# Patient Record
Sex: Female | Born: 2005 | Race: White | Hispanic: Yes | Marital: Single | Smoking: Never smoker
Health system: Southern US, Community
[De-identification: ages and names within clinical notes are randomized; demographics above are authoritative.]

## PROBLEM LIST (undated history)

## (undated) ENCOUNTER — Inpatient Hospital Stay (HOSPITAL_COMMUNITY): Payer: Self-pay

## (undated) ENCOUNTER — Emergency Department (HOSPITAL_COMMUNITY): Admission: EM | Payer: Self-pay | Source: Home / Self Care

## (undated) DIAGNOSIS — J302 Other seasonal allergic rhinitis: Secondary | ICD-10-CM

## (undated) DIAGNOSIS — Z8659 Personal history of other mental and behavioral disorders: Secondary | ICD-10-CM

## (undated) DIAGNOSIS — O24419 Gestational diabetes mellitus in pregnancy, unspecified control: Secondary | ICD-10-CM

## (undated) DIAGNOSIS — O26649 Intrahepatic cholestasis of pregnancy, unspecified trimester: Secondary | ICD-10-CM

## (undated) DIAGNOSIS — F32A Depression, unspecified: Secondary | ICD-10-CM

## (undated) DIAGNOSIS — G43909 Migraine, unspecified, not intractable, without status migrainosus: Secondary | ICD-10-CM

## (undated) DIAGNOSIS — F419 Anxiety disorder, unspecified: Secondary | ICD-10-CM

## (undated) DIAGNOSIS — K589 Irritable bowel syndrome without diarrhea: Secondary | ICD-10-CM

## (undated) HISTORY — PX: NO PAST SURGERIES: SHX2092

## (undated) HISTORY — DX: Gestational diabetes mellitus in pregnancy, unspecified control: O24.419

## (undated) HISTORY — DX: Intrahepatic cholestasis of pregnancy, unspecified trimester: O26.649

---
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
## 2024-01-01 ENCOUNTER — Inpatient Hospital Stay (HOSPITAL_COMMUNITY)
Admission: AD | Admit: 2024-01-01 | Discharge: 2024-01-01 | Disposition: A | Discharge status: Home / Self Care | Payer: MEDICAID | Attending: Obstetrics and Gynecology | Admitting: Obstetrics and Gynecology

## 2024-01-01 ENCOUNTER — Encounter (HOSPITAL_COMMUNITY): Payer: Self-pay | Admitting: Obstetrics and Gynecology

## 2024-01-01 ENCOUNTER — Inpatient Hospital Stay (HOSPITAL_COMMUNITY): Payer: MEDICAID

## 2024-01-01 DIAGNOSIS — O209 Hemorrhage in early pregnancy, unspecified: Secondary | ICD-10-CM

## 2024-01-01 DIAGNOSIS — Z113 Encounter for screening for infections with a predominantly sexual mode of transmission: Secondary | ICD-10-CM | POA: Diagnosis not present

## 2024-01-01 DIAGNOSIS — N898 Other specified noninflammatory disorders of vagina: Secondary | ICD-10-CM | POA: Diagnosis present

## 2024-01-01 DIAGNOSIS — Z3A01 Less than 8 weeks gestation of pregnancy: Secondary | ICD-10-CM

## 2024-01-01 LAB — CBC
HCT: 33.6 % — ABNORMAL LOW (ref 36.0–46.0)
Hemoglobin: 11.8 g/dL — ABNORMAL LOW (ref 12.0–15.0)
MCH: 30.3 pg (ref 26.0–34.0)
MCHC: 35.1 g/dL (ref 30.0–36.0)
MCV: 86.4 fL (ref 80.0–100.0)
Platelets: 248 K/uL (ref 150–400)
RBC: 3.89 MIL/uL (ref 3.87–5.11)
RDW: 13.9 % (ref 11.5–15.5)
WBC: 10.2 K/uL (ref 4.0–10.5)
nRBC: 0 % (ref 0.0–0.2)

## 2024-01-01 LAB — WET PREP, GENITAL
Clue Cells Wet Prep HPF POC: NONE SEEN
Sperm: NONE SEEN
Trich, Wet Prep: NONE SEEN
WBC, Wet Prep HPF POC: >=10 — AB (ref <10)
Yeast Wet Prep HPF POC: NONE SEEN

## 2024-01-01 LAB — HCG, QUANTITATIVE, PREGNANCY: hCG, Beta Chain, Quant, S: 120853 m[IU]/mL — ABNORMAL HIGH (ref <5)

## 2024-01-01 LAB — POCT PREGNANCY, URINE: Preg Test, Ur: POSITIVE — AB

## 2024-01-01 NOTE — Progress Notes (Signed)
 Written and verbal d/c instructions given and understanding voiced.

## 2024-01-01 NOTE — Discharge Instructions (Signed)
 It is a pleasure taking care of you tonight.  I am not quite sure where the vaginal bleeding is coming from but likely related to your recent intercourse.  Cultures are still pending from the samples we collected.  If your bleeding worsens I want you to return for further evaluation.  The ultrasound did show you have a baby growing in your uterus with a heartbeat.  Below is a list of medications that are safe to take during pregnancy.  I hope you have a wonderful rest of your night!  Safe Medications in Pregnancy   Acne:  Benzoyl Peroxide  Salicylic Acid   Backache/Headache:  Tylenol : 2 regular strength every 4 hours OR               2 Extra strength every 6 hours   Colds/Coughs/Allergies:  Benadryl  (alcohol free) 25 mg every 6 hours as needed  Breath right strips  Claritin  Cepacol throat lozenges  Chloraseptic throat spray  Cold-Eeze- up to three times per day  Cough drops, alcohol free  Flonase (by prescription only)  Guaifenesin  Mucinex  Robitussin DM (plain only, alcohol free)  Saline nasal spray/drops  Sudafed (pseudoephedrine) & Actifed * use only after [redacted] weeks gestation and if you do not have high blood pressure  Tylenol   Vicks Vaporub  Zinc lozenges  Zyrtec   Constipation:  Colace  Ducolax suppositories  Fleet enema  Glycerin suppositories  Metamucil  Milk of magnesia  Miralax  Senokot  Smooth move tea   Diarrhea:  Kaopectate  Imodium A-D   *NO pepto Bismol   Hemorrhoids:  Anusol  Anusol HC  Preparation H  Tucks   Indigestion:  Tums  Maalox  Mylanta  Zantac  Pepcid    Insomnia:  Benadryl  (alcohol free) 25mg  every 6 hours as needed  Tylenol  PM  Unisom, no Gelcaps   Leg Cramps:  Tums  MagGel   Nausea/Vomiting:  Bonine  Dramamine  Emetrol  Ginger extract  Sea bands  Meclizine  Nausea medication to take during pregnancy:  Unisom (doxylamine succinate 25 mg tablets) Take one tablet daily at bedtime. If symptoms are not adequately  controlled, the dose can be increased to a maximum recommended dose of two tablets daily (1/2 tablet in the morning, 1/2 tablet mid-afternoon and one at bedtime).  Vitamin B6 100mg  tablets. Take one tablet twice a day (up to 200 mg per day).   Skin Rashes:  Aveeno products  Benadryl  cream or 25mg  every 6 hours as needed  Calamine Lotion  1% cortisone cream   Yeast infection:  Gyne-lotrimin 7  Monistat 7    **If taking multiple medications, please check labels to avoid duplicating the same active ingredients  **take medication as directed on the label  ** Do not exceed 4000 mg of tylenol  in 24 hours  **Do not take medications that contain aspirin or ibuprofen 

## 2024-01-01 NOTE — MAU Provider Note (Signed)
 History     CSN: [id number]  Arrival date and time: 01/01/24 1955   None     Chief Complaint  Patient presents with   Vaginal Discharge   HPI Patient presenting for vaginal bleeding in early pregnancy.  Reports miscarriage last year so she is just worried because of the bleeding.  Reports that she noticed brownish discharge when she wiped and then it was pink today.  Intercourse approximately 1 week ago.  Reports chronic issues with upper right and left abdominal pain.  Had her gallbladder removed earlier this year.  Denies any other symptoms.  OB History     Gravida  2   Para      Term      Preterm      AB  1   Living         SAB  1   IAB      Ectopic      Multiple      Live Births              Past Medical History:  Diagnosis Date   Depression    History of suicidal ideation    IBS (irritable bowel syndrome)    Migraines    Seasonal allergies    Severe anxiety    followed by dr macarthur piety    Past Surgical History:  Procedure Laterality Date   CHOLECYSTECTOMY N/A 10/14/2023   Procedure: LAPAROSCOPIC CHOLECYSTECTOMY;  Surgeon: Belinda Cough, MD;  Location: [facility]S;  Service: General;  Laterality: N/A;   COLONOSCOPY N/A 10/11/2023   Procedure: COLONOSCOPY;  Surgeon: Dianna Specking, MD;  Location: [facility] ENDOSCOPY;  Service: Gastroenterology;  Laterality: N/A;   DILATION AND EVACUATION N/A 12/06/2022   Procedure: DILATATION AND EVACUATION;  Surgeon: Erik Kieth BROCKS, MD;  Location[facility];  Service: Gynecology;  Laterality: N/A;    Family History  Problem Relation Age of Onset   Diabetes Mother    Healthy Father    Hypertension Neg Hx    Cancer Neg Hx     Social History   Tobacco Use   Smoking status: Never    Passive exposure: Current   Smokeless tobacco: Never  Vaping Use   Vaping status: Never Used  Substance Use Topics   Alcohol use: Never   Drug use: Yes    Types: Marijuana    Comment: pos drug screen     Allergies:  Allergies  Allergen Reactions   Azithromycin  Itching and Nausea And Vomiting    Severe N/V and severe itching   Lactose Intolerance (Gi) Other (See Comments)    GI Upset    Medications Prior to Admission  Medication Sig Dispense Refill Last Dose/Taking   acetaminophen  (TYLENOL ) 500 MG tablet Take 2 tablets (1,000 mg total) by mouth every 6 (six) hours as needed for mild pain (pain score 1-3).      ibuprofen  (ADVIL ) 800 MG tablet Take 1 tablet (800 mg total) by mouth every 8 (eight) hours as needed. TAKE WITH FOOD 30 tablet 0    oxyCODONE  (OXY IR/ROXICODONE ) 5 MG immediate release tablet Take 1 tablet (5 mg total) by mouth every 4 (four) hours as needed for moderate pain (pain score 4-6). 15 tablet 0     Review of Systems  Gastrointestinal:  Positive for abdominal pain and nausea. Negative for constipation and diarrhea.  Genitourinary:  Positive for vaginal bleeding and vaginal discharge.   Physical Exam   Blood pressure (!) 103/51, pulse 80, temperature  98.8 F (37.1 C), resp. rate 17, height 4' 11 (1.499 m), weight 47.8 kg, last menstrual period 11/04/2023, SpO2 100%.  Physical Exam Vitals and nursing note reviewed.  Constitutional:      Appearance: Normal appearance.  HENT:     Head: Normocephalic and atraumatic.     Nose: No congestion or rhinorrhea.  Eyes:     Extraocular Movements: Extraocular movements intact.  Cardiovascular:     Rate and Rhythm: Normal rate.  Pulmonary:     Effort: Pulmonary effort is normal.  Abdominal:     Palpations: Abdomen is soft.     Tenderness: There is no abdominal tenderness.  Musculoskeletal:        General: Normal range of motion.     Cervical back: Normal range of motion.  Skin:    General: Skin is warm.     Capillary Refill: Capillary refill takes less than 2 seconds.  Neurological:     General: No focal deficit present.     Mental Status: She is alert.     Cranial Nerves: No cranial nerve deficit.   Psychiatric:        Mood and Affect: Mood normal.        Behavior: Behavior normal.     MAU Course  Procedures  MDM CBC hCG level Ultrasound  Assessment and Plan  Allison Trevino is a 18 year old G2, P0 at [redacted] weeks gestation presenting for vaginal bleeding in early pregnancy  Vaginal bleeding in early pregnancy Noticed spotting today.  Intercourse approximately 1 week ago.  Wet prep showing white blood cells but otherwise within normal limits.  GC/chlamydia pending.  Ultrasound showing viable IUP measuring 7 weeks and 5 days.  No subchorionic hemorrhage visualized.  Discussed that this could be early signs of miscarriage but could also be cervical friability.  Discussed tricked return precautions.  Patient discharged home.  Deantre Bourdon V Reatha Sur 01/01/2024, 11:32 PM

## 2024-01-01 NOTE — MAU Note (Signed)
 Allison Trevino is a 18 y.o. at Unknown here in MAU reporting light and dark brown and pink vag d/c today.  Having some lower abd cramping.   LMP: 11/04/23 Onset of complaint: today Pain score: 6 Vitals:   01/01/24 2024 01/01/24 2028  BP:  (!) 103/51  Pulse: 80   Resp: 17   Temp: 98.8 F (37.1 C)   SpO2: 100%      FHT: n/a  Lab orders placed from triage: upt and u/a

## 2024-01-02 LAB — GC/CHLAMYDIA PROBE AMP (~~LOC~~) NOT AT ARMC
Chlamydia: NEGATIVE
Comment: NEGATIVE
Comment: NORMAL
Neisseria Gonorrhea: NEGATIVE

## 2024-01-05 ENCOUNTER — Other Ambulatory Visit: Payer: MEDICAID

## 2024-01-15 ENCOUNTER — Other Ambulatory Visit (INDEPENDENT_AMBULATORY_CARE_PROVIDER_SITE_OTHER): Payer: MEDICAID

## 2024-01-15 ENCOUNTER — Ambulatory Visit: Payer: MEDICAID

## 2024-01-15 VITALS — BP 123/71 | HR 61 | Wt 103.1 lb

## 2024-01-15 DIAGNOSIS — O0990 Supervision of high risk pregnancy, unspecified, unspecified trimester: Secondary | ICD-10-CM | POA: Insufficient documentation

## 2024-01-15 DIAGNOSIS — Z362 Encounter for other antenatal screening follow-up: Secondary | ICD-10-CM

## 2024-01-15 DIAGNOSIS — Z3A09 9 weeks gestation of pregnancy: Secondary | ICD-10-CM

## 2024-01-15 DIAGNOSIS — Z1331 Encounter for screening for depression: Secondary | ICD-10-CM

## 2024-01-15 DIAGNOSIS — Z3480 Encounter for supervision of other normal pregnancy, unspecified trimester: Secondary | ICD-10-CM | POA: Insufficient documentation

## 2024-01-15 DIAGNOSIS — Z3481 Encounter for supervision of other normal pregnancy, first trimester: Secondary | ICD-10-CM | POA: Diagnosis not present

## 2024-01-15 NOTE — Progress Notes (Signed)
 New OB Intake  I connected with Allison Trevino  on 01/15/24 at  2:10 PM EDT by In Person Visit and verified that I am speaking with the correct person using two identifiers. Nurse is located at [facility] and pt is located at [place].  I discussed the limitations, risks, security and privacy concerns of performing an evaluation and management service by telephone and the availability of in person appointments. I also discussed with the patient that there may be a patient responsible charge related to this service. The patient expressed understanding and agreed to proceed.  I explained I am completing New OB Intake today. We discussed EDD of 08/14/24 based on LMP of 11/08/23. Pt is G2P0010. I reviewed her allergies, medications and Medical/Surgical/OB history.    Patient Active Problem List   Diagnosis Date Noted   Acute colitis 10/11/2023   Colitis 10/11/2023   Pancolitis (HCC) 10/10/2023   Acute anemia 10/10/2023   Nausea & vomiting 10/09/2023   Chlamydia infection affecting pregnancy 11/13/2022   Abnormal involuntary movement 04/08/2019   Anxiety state 04/08/2019   Insomnia 04/08/2019     Concerns addressed today  Delivery Plans Plans to deliver at [facility]. Discussed the nature of our practice with multiple providers including residents and students. Due to the size of the practice, the delivering provider may not be the same as those providing prenatal care.   Patient is interested in water birth.  MyChart/Babyscripts MyChart access verified. I explained pt will have some visits in office and some virtually. Babyscripts instructions given and order placed. Patient verifies receipt of registration text/e-mail. Account successfully created and app downloaded. If patient is a candidate for Optimized scheduling, add to sticky note.   Blood Pressure Cuff/Weight Scale Pt has BP cuff at home. Explained after first prenatal appt pt will check weekly and document in Babyscripts.  Patient does not have weight scale; patient may purchase if they desire to track weight weekly in Babyscripts.  Anatomy US  Explained first scheduled US  will be around 19 weeks. Anatomy US  scheduled for TBD at TBD.  Is patient a candidate for Babyscripts Optimization? Yes, patient accepted    First visit review I reviewed new OB appt with patient. Explained pt will be seen by Nidia Daring, NP at first visit. Discussed [place] genetic screening with patient. Requests Panorama and Horizon.. Routine prenatal labs OB Urine only collected at today's visit. Initial OB labs deferred to New OB appt.   Last Pap No results found for: DIAGPAP  Allison CHRISTELLA Ober, RN 01/15/2024  2:55 PM

## 2024-01-15 NOTE — Patient Instructions (Signed)

## 2024-01-16 ENCOUNTER — Telehealth: Payer: Self-pay

## 2024-01-16 NOTE — Telephone Encounter (Signed)
 Returned call to advise pt that dentist office stated that they did not have the pregnancy clearance letter. Also advised pt that the dentist advised to take OTC Tylenol  for pain, no other pain medications needed. Advised dentist and pt that we will fax over clearance letter.

## 2024-01-17 ENCOUNTER — Ambulatory Visit: Payer: Self-pay | Admitting: Obstetrics and Gynecology

## 2024-01-17 ENCOUNTER — Inpatient Hospital Stay (HOSPITAL_COMMUNITY)
Admission: AD | Admit: 2024-01-17 | Discharge: 2024-01-17 | Disposition: A | Discharge status: Home / Self Care | Payer: MEDICAID | Attending: Obstetrics and Gynecology | Admitting: Obstetrics and Gynecology

## 2024-01-17 ENCOUNTER — Encounter (HOSPITAL_COMMUNITY): Payer: Self-pay | Admitting: Obstetrics and Gynecology

## 2024-01-17 ENCOUNTER — Other Ambulatory Visit: Payer: Self-pay

## 2024-01-17 DIAGNOSIS — Z3480 Encounter for supervision of other normal pregnancy, unspecified trimester: Secondary | ICD-10-CM

## 2024-01-17 DIAGNOSIS — R1084 Generalized abdominal pain: Secondary | ICD-10-CM

## 2024-01-17 DIAGNOSIS — O26891 Other specified pregnancy related conditions, first trimester: Secondary | ICD-10-CM

## 2024-01-17 DIAGNOSIS — Z3A10 10 weeks gestation of pregnancy: Secondary | ICD-10-CM | POA: Diagnosis not present

## 2024-01-17 DIAGNOSIS — K0889 Other specified disorders of teeth and supporting structures: Secondary | ICD-10-CM | POA: Diagnosis not present

## 2024-01-17 DIAGNOSIS — M545 Low back pain, unspecified: Secondary | ICD-10-CM | POA: Insufficient documentation

## 2024-01-17 LAB — CBC
HCT: 33.3 % — ABNORMAL LOW (ref 36.0–46.0)
Hemoglobin: 11.4 g/dL — ABNORMAL LOW (ref 12.0–15.0)
MCH: 30.1 pg (ref 26.0–34.0)
MCHC: 34.2 g/dL (ref 30.0–36.0)
MCV: 87.9 fL (ref 80.0–100.0)
Platelets: 221 K/uL (ref 150–400)
RBC: 3.79 MIL/uL — ABNORMAL LOW (ref 3.87–5.11)
RDW: 13.8 % (ref 11.5–15.5)
WBC: 13.1 K/uL — ABNORMAL HIGH (ref 4.0–10.5)
nRBC: 0 % (ref 0.0–0.2)

## 2024-01-17 LAB — URINALYSIS, ROUTINE W REFLEX MICROSCOPIC
Bilirubin Urine: NEGATIVE
Glucose, UA: NEGATIVE mg/dL
Hgb urine dipstick: NEGATIVE
Ketones, ur: NEGATIVE mg/dL
Leukocytes,Ua: NEGATIVE
Nitrite: NEGATIVE
Protein, ur: NEGATIVE mg/dL
Specific Gravity, Urine: 1.031 — ABNORMAL HIGH (ref 1.005–1.030)
pH: 5 (ref 5.0–8.0)

## 2024-01-17 LAB — CULTURE, OB URINE

## 2024-01-17 LAB — URINE CULTURE, OB REFLEX

## 2024-01-17 MED ORDER — TRAMADOL HCL 50 MG PO TABS
50.0000 mg | ORAL_TABLET | Freq: Once | ORAL | Status: AC
  Administered 2024-01-17: 50 mg via ORAL

## 2024-01-17 MED ORDER — SIMETHICONE 80 MG PO CHEW: 80.0000 mg | CHEWABLE_TABLET | Freq: Four times a day (QID) | ORAL | 0 refills | Status: DC | PRN

## 2024-01-17 MED ORDER — TRAMADOL HCL 50 MG PO TABS: 50.0000 mg | ORAL_TABLET | Freq: Once | ORAL | Status: DC

## 2024-01-17 MED ORDER — HYOSCYAMINE SULFATE 0.125 MG SL SUBL
0.1250 mg | SUBLINGUAL_TABLET | Freq: Once | SUBLINGUAL | Status: AC
  Administered 2024-01-17: 0.125 mg via SUBLINGUAL
  Filled 2024-01-17: qty 1

## 2024-01-17 MED ORDER — ONDANSETRON 4 MG PO TBDP
4.0000 mg | ORAL_TABLET | Freq: Once | ORAL | Status: AC
  Administered 2024-01-17: 4 mg via ORAL
  Filled 2024-01-17: qty 1

## 2024-01-17 MED ORDER — SIMETHICONE 80 MG PO CHEW
80.0000 mg | CHEWABLE_TABLET | Freq: Once | ORAL | Status: AC
  Administered 2024-01-17: 80 mg via ORAL
  Filled 2024-01-17: qty 1

## 2024-01-17 MED ORDER — TRAMADOL HCL 50 MG PO TABS
25.0000 mg | ORAL_TABLET | Freq: Once | ORAL | Status: DC
  Filled 2024-01-17: qty 1

## 2024-01-17 MED ORDER — TRAMADOL HCL 50 MG PO TABS: 25.0000 mg | ORAL_TABLET | Freq: Four times a day (QID) | ORAL | 0 refills | Status: DC | PRN

## 2024-01-17 NOTE — MAU Provider Note (Signed)
 Chief Complaint: Abdominal Pain, Back Pain, and Dizziness   Event Date/Time   First Provider Initiated Contact with Patient 01/17/24 0418        SUBJECTIVE HPI: Allison Trevino is a 18 y.o. G2P0010 at [redacted]w[redacted]d by LMP who presents to maternity admissions reporting bilateral abdominal pain which started tonight   Also complains of tooth pain, states dentist would not give her anything for it. Mother of pt wonders if gallstones have come back  Had cholecystectomy in April  Discussed not likely without a gallbladder. Also pain is in a different location. . She denies vaginal bleeding, vaginal itching/burning, urinary symptoms, h/a, dizziness, n/v, or fever/chills.     Abdominal Pain The problem occurs intermittently. The quality of the pain is described as cramping. Pertinent negatives include no constipation, diarrhea or dysuria. Nothing relieves the symptoms. Past treatments include nothing.  RN Note: Allison Trevino is a 18 y.o. at [redacted]w[redacted]d here in MAU reporting: woke up about 11pm went to BR . Started having a pain in her right lower back and lower abd  that felt like when she had her gallbladder out.  Stated she had to sit  on the floor because she started to feel dizzy and everything ws getting blacK'. Currently she  is still feeling dizzy and pai in her back is in her right and left side. Stated she can't fee; her legs . Has been having nausea but has started vomiting since this pain started. Reports a light brown vag discharge since yesterday   Past Medical History:  Diagnosis Date   Depression    History of suicidal ideation    IBS (irritable bowel syndrome)    Migraines    Seasonal allergies    Severe anxiety    followed by dr macarthur piety   Past Surgical History:  Procedure Laterality Date   CHOLECYSTECTOMY N/A 10/14/2023   Procedure: LAPAROSCOPIC CHOLECYSTECTOMY;  Surgeon: Belinda Cough, MD;  Location: [facility];  Service: General;  Laterality: N/A;   COLONOSCOPY N/A  10/11/2023   Procedure: COLONOSCOPY;  Surgeon: Dianna Specking, MD;  [facility]: [facility];  Service: Gastroenterology;  Laterality: N/A;   DILATION AND EVACUATION N/A 12/06/2022   Procedure: DILATATION AND EVACUATION;  Surgeon: Erik Kieth BROCKS, MD;  Location: [facility];  Service: Gynecology;  Laterality: N/A;   Social History   Socioeconomic History   Marital status: Single    Spouse name: Not on file   Number of children: Not on file   Years of education: Not on file   Highest education level: 12th grade  Occupational History   Not on file  Tobacco Use   Smoking status: Never    Passive exposure: Current   Smokeless tobacco: Never  Vaping Use   Vaping status: Never Used  Substance and Sexual Activity   Alcohol use: Never   Drug use: Not Currently    Types: Marijuana    Comment: pos drug screen   Sexual activity: Yes  Other Topics Concern   Not on file  Social History Narrative   Ayjah graduated from [employer] May 2024   She lives with both parents, four siblings,  she is the youngest   Social Drivers of Corporate investment banker Strain: Not on file  Food Insecurity: No Food Insecurity (10/10/2023)   Hunger Vital Sign    Worried About Running Out of Food in the Last Year: Never true    Ran Out of Food in the Last Year: Never true  Transportation Needs: No Transportation Needs (10/10/2023)   PRAPARE - Administrator, Civil Service (Medical): No    Lack of Transportation (Non-Medical): No  Physical Activity: Not on file  Stress: Not on file  Social Connections: Not on file  Intimate Partner Violence: Not At Risk (10/10/2023)   Humiliation, Afraid, Rape, and Kick questionnaire    Fear of Current or Ex-Partner: No    Emotionally Abused: No    Physically Abused: No    Sexually Abused: No   No current facility-administered medications on file prior to encounter.   Current Outpatient Medications on File Prior to Encounter   Medication Sig Dispense Refill   Prenatal Vit-Fe Fumarate-FA (PRENATAL VITAMIN PLUS LOW IRON PO) Take 1 tablet by mouth daily.     acetaminophen  (TYLENOL ) 500 MG tablet Take 2 tablets (1,000 mg total) by mouth every 6 (six) hours as needed for mild pain (pain score 1-3).     ibuprofen  (ADVIL ) 800 MG tablet Take 1 tablet (800 mg total) by mouth every 8 (eight) hours as needed. TAKE WITH FOOD 30 tablet 0   oxyCODONE  (OXY IR/ROXICODONE ) 5 MG immediate release tablet Take 1 tablet (5 mg total) by mouth every 4 (four) hours as needed for moderate pain (pain score 4-6). (Patient not taking: Reported on 01/15/2024) 15 tablet 0   propranolol  (INDERAL ) 10 MG tablet Take 10 mg by mouth daily as needed (anxiety).     Allergies  Allergen Reactions   Azithromycin  Itching and Nausea And Vomiting    Severe N/V and severe itching   Lactose Intolerance (Gi) Other (See Comments)    GI Upset   Hydromorphone  Rash    Possible allergy to opiates including oxycodone  / dilaudid  / Fentanyl . Received opiate medications (dilaudid , fentanyl ) as well as Augmentin  on 4/15 developed rash. On 4/16 received Oxycodone  and Ceftriaxone  around same time + developed recurrent rash / itching.    I have reviewed patient's Past Medical Hx, Surgical Hx, Family Hx, Social Hx, medications and allergies.   ROS:  Review of Systems  Gastrointestinal:  Positive for abdominal pain. Negative for constipation and diarrhea.  Genitourinary:  Negative for dysuria.   Review of Systems  Other systems negative   Physical Exam  Physical Exam Patient Vitals for the past 24 hrs:  BP Temp Pulse Resp Height Weight  01/17/24 0245 109/62 98.9 F (37.2 C) 69 18 4' 11 (1.499 m) 49.9 kg   Constitutional: Well-developed, well-nourished female in no acute distress.  Cardiovascular: normal rate Respiratory: normal effort GI: Abd soft, non-tender. Pos BS x 4  No rebound or guarding MS: Extremities nontender, no edema, normal ROM Neurologic:  Alert and oriented x 4.   PELVIC EXAM: deferred FHT 170 by doppler  LAB RESULTS Results for orders placed or performed during the hospital encounter of 01/17/24 (from the past 24 hours)  Urinalysis, Routine w reflex microscopic -Urine, Clean Catch     Status: Abnormal   Collection Time: 01/17/24  3:00 AM  Result Value Ref Range   Color, Urine YELLOW YELLOW   APPearance HAZY (A) CLEAR   Specific Gravity, Urine 1.031 (H) 1.005 - 1.030   pH 5.0 5.0 - 8.0   Glucose, UA NEGATIVE NEGATIVE mg/dL   Hgb urine dipstick NEGATIVE NEGATIVE   Bilirubin Urine NEGATIVE NEGATIVE   Ketones, ur NEGATIVE NEGATIVE mg/dL   Protein, ur NEGATIVE NEGATIVE mg/dL   Nitrite NEGATIVE NEGATIVE   Leukocytes,Ua NEGATIVE NEGATIVE    --/--/O POS (04/16 0615)  IMAGING US   OB Comp Less 14 Wks Result Date: 01/01/2024 CLINICAL DATA:  Vaginal bleeding EXAM: OBSTETRIC <14 WK ULTRASOUND TECHNIQUE: Transabdominal ultrasound was performed for evaluation of the gestation as well as the maternal uterus and adnexal regions. COMPARISON:  None Available. FINDINGS: Intrauterine gestational sac: Single Yolk sac:  Visualized. Embryo:  Visualized. Cardiac Activity: Visualized. Heart Rate: 165 bpm MSD:    mm    w     d CRL:   13.7 mm   7 w 5 d                  US  EDC: 08/14/2024 Subchorionic hemorrhage:  None visualized. Maternal uterus/adnexae: No adnexal mass or free fluid. IMPRESSION: Seven week 5 day intrauterine pregnancy. Fetal heart rate 165 beats per minute. No acute maternal findings. Electronically Signed   By: Franky Crease M.D.   On: 01/01/2024 23:14    MAU Management/MDM: I have reviewed the triage vital signs and the nursing notes.   Pertinent labs & imaging results that were available during my care of the patient were reviewed by me and considered in my medical decision making (see chart for details).      I have reviewed her medical records including past results, notes and treatments. Medical, Surgical, and family  history were reviewed.  Medications and recent lab tests were reviewed  Ordered usual first trimester r/o ectopic labs.   Pelvic exam and cultures done Will check baseline Ultrasound to rule out ectopic.  Treatments in MAU included Levsin  and simethicone  given for abdominal colicky pain.. Tramadol  given for tooth pain.   This bleeding/pain can represent a normal pregnancy with bleeding, spontaneous abortion or even an ectopic which can be life-threatening.  The process as listed above helps to determine which of these is present.    ASSESSMENT S[place] IUP at [redacted]w[redacted]d Colicky abdominal pain, likely GI source Tooth pain  PLAN Discharge home Encouraged to keep [facility] appointments  Pt stable at time of discharge. Encouraged to return here if she develops worsening of symptoms, increase in pain, fever, or other concerning symptoms.    Earnie Pouch CNM, MSN Certified Nurse-Midwife 01/17/2024  4:18 AM

## 2024-01-17 NOTE — MAU Note (Signed)
 Pt sat up to leave and vomited. Notified provider Zofran  ordered and given. Pt did not want to wait 20 min and mother wheeled her out to the car.

## 2024-01-17 NOTE — MAU Note (Signed)
 Allison Trevino is a 18 y.o. at [redacted]w[redacted]d here in MAU reporting: woke up about 11pm went to BR . Started having a pain in her right lower back and lower abd  that felt like when she had her gallbladder out.  Stated she had to sit  on the floor because she started to feel dizzy and everything ws getting blacK'. Currently she  is still feeling dizzy and pai in her back is in her right and left side. Stated she can't fee; her legs . Has been having nausea but has started vomiting since this pain started. Reports a light brown vag discharge since yesterday LMP:  Onset of complaint: 11pm Pain score: 7-8 Vitals:   01/17/24 0245  BP: 109/62  Pulse: 69  Resp: 18  Temp: 98.9 F (37.2 C)     FHT:   Lab orders placed from triage: u/a

## 2024-01-18 ENCOUNTER — Telehealth: Payer: Self-pay

## 2024-01-18 ENCOUNTER — Other Ambulatory Visit: Payer: Self-pay | Admitting: Obstetrics and Gynecology

## 2024-01-18 MED ORDER — AMOXICILLIN 500 MG PO CAPS: 500.0000 mg | ORAL_CAPSULE | Freq: Three times a day (TID) | ORAL | 0 refills | Status: DC

## 2024-01-18 NOTE — Telephone Encounter (Signed)
 Returned call regarding antibiotics for dental appt. No answer, left vm to call

## 2024-01-30 ENCOUNTER — Ambulatory Visit: Payer: MEDICAID | Admitting: Obstetrics and Gynecology

## 2024-01-30 ENCOUNTER — Encounter: Payer: Self-pay | Admitting: Obstetrics and Gynecology

## 2024-01-30 VITALS — BP 104/65 | HR 65 | Wt 104.0 lb

## 2024-01-30 DIAGNOSIS — Z3A11 11 weeks gestation of pregnancy: Secondary | ICD-10-CM | POA: Diagnosis not present

## 2024-01-30 DIAGNOSIS — Z3481 Encounter for supervision of other normal pregnancy, first trimester: Secondary | ICD-10-CM | POA: Diagnosis not present

## 2024-01-30 DIAGNOSIS — Z3480 Encounter for supervision of other normal pregnancy, unspecified trimester: Secondary | ICD-10-CM

## 2024-01-30 MED ORDER — ASPIRIN 81 MG PO TBEC: 81.0000 mg | DELAYED_RELEASE_TABLET | Freq: Every day | ORAL | 2 refills | Status: DC

## 2024-01-30 NOTE — Progress Notes (Signed)
 Pt presents for NOB visit. Pt concerned about med given at hospital

## 2024-01-30 NOTE — Addendum Note (Signed)
 Addended by: DELORES NIDIA CROME on: 01/30/2024 10:17 AM   Modules accepted: Orders

## 2024-01-30 NOTE — Patient Instructions (Signed)
   Considering Waterbirth? Guide for patients at [facility]) Why consider waterbirth? Gentle birth for babies  Less pain medicine used in labor  May allow for passive descent/less pushing  May reduce perineal tears  More mobility and instinctive maternal position changes  Increased maternal relaxation   Is waterbirth safe? What are the risks of infection, drowning or other complications? Infection:  Very low risk (3.7 % for tub vs 4.8% for bed)  7 in 8000 waterbirths with documented infection  Poorly cleaned equipment most common cause  Slightly lower group B strep transmission rate  Drowning  Maternal:  Very low risk  Related to seizures or fainting  Newborn:  Very low risk. No evidence of increased risk of respiratory problems in multiple large studies  Physiological protection from breathing under water  Avoid underwater birth if there are any fetal complications  Once baby's head is out of the water, keep it out.  Birth complication  Some reports of cord trauma, but risk decreased by bringing baby to surface gradually  No evidence of increased risk of shoulder dystocia. Mothers can usually change positions faster in water than in a bed, possibly aiding the maneuvers to free the shoulder.   There are 2 things you MUST do to have a waterbirth with [facility]: Attend a waterbirth class at [facility]   3rd Wednesday of every month from 7-9 pm (virtual during COVID) [employer] at [email or web address] or [email or web address] or by calling [phone] Bring [place] the certificate from the class to your prenatal appointment or send via MyChart Meet with a midwife at 36 weeks* to see if you can still plan a waterbirth and to sign the consent.   *We also recommend that you schedule as many of your prenatal visits with a midwife as possible.    Helpful information: You may want to bring a bathing suit top to the hospital  to wear during labor but this is optional.  All other supplies are provided by the hospital. Please arrive at the hospital with signs of active labor, and do not wait at home until late in labor. It takes 45 min- 1 hour for fetal monitoring, and check in to your room to take place, plus transport and filling of the waterbirth tub.    Things that would prevent you from having a waterbirth: Premature, <37wks  Previous cesarean birth  Presence of thick meconium-stained fluid  Multiple gestation (Twins, triplets, etc.)  Uncontrolled diabetes or gestational diabetes requiring medication  Hypertension diagnosed in pregnancy or preexisting hypertension (gestational hypertension, preeclampsia, or chronic hypertension) Fetal growth restriction (your baby measures less than 10th percentile on ultrasound) Heavy vaginal bleeding  Non-reassuring fetal heart rate  Active infection (MRSA, etc.). Group B Strep is NOT a contraindication for waterbirth.  If your labor has to be induced and induction method requires continuous monitoring of the baby's heart rate  Other risks/issues identified by your obstetrical provider   Please remember that birth is unpredictable. Under certain unforeseeable circumstances your provider may advise against giving birth in the tub. These decisions will be made on a case-by-case basis and with the safety of you and your baby as our highest priority.    Updated 09/28/21

## 2024-01-30 NOTE — Progress Notes (Signed)
 INITIAL PRENATAL VISIT  History:  Allison Trevino is a 18 y.o. G2P0010 at [redacted]w[redacted]d by LMP being seen today for her first obstetrical visit.  Her obstetrical history is significant for None. Patient breast and formlua intend to breast feed. Pregnancy history fully reviewed.  Patient reports No complaints.  HISTORY: OB History  Gravida Para Term Preterm AB Living  2 0 0 0 1 0  SAB IAB Ectopic Multiple Live Births  1 0 0 0 0    # Outcome Date GA Lbr Len/2nd Weight Sex Type Anes PTL Lv  2 Current           1 SAB 12/06/22 [redacted]w[redacted]d           Last pap smear was done N/A and was N/A  Past Medical History:  Diagnosis Date   Depression    History of suicidal ideation    IBS (irritable bowel syndrome)    Migraines    Seasonal allergies    Severe anxiety    followed by dr macarthur piety   Past Surgical History:  Procedure Laterality Date   CHOLECYSTECTOMY N/A 10/14/2023   Procedure: LAPAROSCOPIC CHOLECYSTECTOMY;  Surgeon: Belinda Cough, MD;  Location: [facility];  Service: General;  Laterality: N/A;   COLONOSCOPY N/A 10/11/2023   Procedure: COLONOSCOPY;  Surgeon: Dianna Specking, MD;  [facility];  Service: Gastroenterology;  Laterality: N/A;   DILATION AND EVACUATION N/A 12/06/2022   Procedure: DILATATION AND EVACUATION;  Surgeon: Erik Kieth BROCKS, MD;  [facility];  Service: Gynecology;  Laterality: N/A;   Family History  Problem Relation Age of Onset   Diabetes Mother    Healthy Father    Hypertension Neg Hx    Cancer Neg Hx    Social History   Tobacco Use   Smoking status: Never    Passive exposure: Current   Smokeless tobacco: Never  Vaping Use   Vaping status: Never Used  Substance Use Topics   Alcohol use: Never   Drug use: Not Currently    Types: Marijuana    Comment: pos drug screen. Has not used since couple of weeks   Allergies  Allergen Reactions   Azithromycin  Itching and Nausea And Vomiting    Severe N/V and  severe itching   Lactose Intolerance (Gi) Other (See Comments)    GI Upset   Hydromorphone  Rash    Possible allergy to opiates including oxycodone  / dilaudid  / Fentanyl . Received opiate medications (dilaudid , fentanyl ) as well as Augmentin  on 4/15 developed rash. On 4/16 received Oxycodone  and Ceftriaxone  around same time + developed recurrent rash / itching.   Current Outpatient Medications on File Prior to Visit  Medication Sig Dispense Refill   acetaminophen  (TYLENOL ) 500 MG tablet Take 2 tablets (1,000 mg total) by mouth every 6 (six) hours as needed for mild pain (pain score 1-3).     Prenatal Vit-Fe Fumarate-FA (PRENATAL VITAMIN PLUS LOW IRON PO) Take 1 tablet by mouth daily.     propranolol  (INDERAL ) 10 MG tablet Take 10 mg by mouth daily as needed (anxiety).     simethicone  (MYLICON) 80 MG chewable tablet Chew 1 tablet (80 mg total) by mouth every 6 (six) hours as needed. 30 tablet 0   amoxicillin  (AMOXIL ) 500 MG capsule Take 1 capsule (500 mg total) by mouth 3 (three) times daily. (Patient not taking: Reported on 01/30/2024) 21 capsule 0   traMADol  (ULTRAM ) 50 MG tablet Take 0.5 tablets (25 mg total) by mouth every 6 (six)  hours as needed for severe pain (pain score 7-10). (Patient not taking: Reported on 01/30/2024) 10 tablet 0   No current facility-administered medications on file prior to visit.    Review of Systems Pertinent items noted in HPI and remainder of comprehensive ROS otherwise negative.  Indications for ASA therapy (per UpToDate) One of the following: (Needs 162 mg daily) Previous pregnancy with preeclampsia, especially early onset and with an adverse outcome No Chronic hypertension No Type 1 or 2 diabetes mellitus No Multifetal gestation No Chronic kidney disease No Autoimmune disease (antiphospholipid syndrome, systemic lupus erythematosus) No Two or more of the following: (Can do 81 mg daily) Nulliparity Yes Obesity (body mass index >30 kg/m2) No Family history  of preeclampsia in mother or sister No Age >=35 years No Sociodemographic characteristics (African American race, low socioeconomic level) Yes Personal risk factors (eg, previous pregnancy with low birth weight or small for gestational age infant, previous adverse pregnancy outcome [eg, stillbirth], interval >10 years between pregnancies) No In vitro conception No  Physical Exam:   Vitals:   01/30/24 0918  BP: 104/65  Pulse: 65  Weight: 47.2 kg   Fetal Heart Rate (bpm): 166  Viable intrauterine pregnancy with positive cardiac activity noted, fetal heart rate 166 bpm  General: well-developed, well-nourished female in no acute distress  Breasts:  normal appearance, no masses or tenderness bilaterally, exam done in the presence of a chaperone.   Skin: normal coloration and turgor, no rashes  Neurologic: oriented, normal, negative, normal mood  Extremities: normal strength, tone, and muscle mass, ROM of all joints is normal  HEENT PERRLA, extraocular movement intact and sclera clear, anicteric  Neck supple and no masses  Cardiovascular: regular rate and rhythm  Respiratory:  no respiratory distress, normal breath sounds  Abdomen: soft, non-tender; bowel sounds normal; no masses,  no organomegaly  Pelvic: Deferred   Assessment:  Pregnancy: G2P0010 Patient Active Problem List   Diagnosis Date Noted   Supervision of other normal pregnancy, antepartum 01/15/2024   Acute colitis 10/11/2023   Colitis 10/11/2023   Pancolitis (HCC) 10/10/2023   Acute anemia 10/10/2023   Nausea & vomiting 10/09/2023   Chlamydia infection affecting pregnancy 11/13/2022   Abnormal involuntary movement 04/08/2019   Anxiety state 04/08/2019   Insomnia 04/08/2019    Plan:  1. Supervision of other normal pregnancy, antepartum (Primary) Doing well. FHR and BP Normal - CBC/D/Plt+RPR+Rh+ABO+RubIgG... - PANORAMA PRENATAL TEST - HORIZON Basic Panel - HgB A1c  Return in 4 weeks for ROB and AFP  2. [redacted]  weeks gestation of pregnancy - CBC/D/Plt+RPR+Rh+ABO+RubIgG... - PANORAMA PRENATAL TEST - HORIZON Basic Panel - HgB A1c   Initial labs drawn. Continue prenatal vitamins. Problem list reviewed and updated. Genetic Screening discussed, Panorama and Horizon: ordered. Ultrasound discussed; fetal anatomic survey: planned, ordered. Anticipatory guidance about prenatal visits given including labs, ultrasounds, and testing. Weight gain recommendations per IOM guidelines reviewed: underweight/BMI 18.5 or less > 28 - 40 lbs; normal weight/BMI 18.5 - 24.9 > 25 - 35 lbs; overweight/BMI 25 - 29.9 > 15 - 25 lbs; obese/BMI 30 or more > 11 - 20 lbs. Discussed usage of the Babyscripts app for more information about pregnancy, and to track blood pressures. Also discussed usage of virtual visits as additional source of managing and completing prenatal visits.  Patient was encouraged to use MyChart to review results, send requests, and have questions addressed.   The nature of [facility]Faculty Practice with multiple MDs and Advanced Practice Providers was explained  to patient; also emphasized that residents, students are part of our team. Routine obstetric precautions reviewed. Encouraged to seek out care at our office or emergency room [facility] MAU preferred) for urgent and/or emergent concerns. Return in about 4 weeks (around 02/27/2024) for OB VISIT (MD or APP).    Derrek JINNY Freund, NP Student

## 2024-01-31 ENCOUNTER — Ambulatory Visit: Payer: Self-pay | Admitting: Obstetrics and Gynecology

## 2024-01-31 DIAGNOSIS — Z3480 Encounter for supervision of other normal pregnancy, unspecified trimester: Secondary | ICD-10-CM

## 2024-01-31 DIAGNOSIS — O09899 Supervision of other high risk pregnancies, unspecified trimester: Secondary | ICD-10-CM | POA: Insufficient documentation

## 2024-01-31 LAB — CBC/D/PLT+RPR+RH+ABO+RUBIGG...
Antibody Screen: NEGATIVE
Basophils Absolute: 0 x10E3/uL (ref 0.0–0.2)
Basos: 1 %
EOS (ABSOLUTE): 0.1 x10E3/uL (ref 0.0–0.4)
Eos: 2 %
HCV Ab: NONREACTIVE
HIV Screen 4th Generation wRfx: NONREACTIVE
Hematocrit: 36.4 % (ref 34.0–46.6)
Hemoglobin: 11.8 g/dL (ref 11.1–15.9)
Hepatitis B Surface Ag: NEGATIVE
Immature Grans (Abs): 0 x10E3/uL (ref 0.0–0.1)
Immature Granulocytes: 0 %
Lymphocytes Absolute: 2.5 x10E3/uL (ref 0.7–3.1)
Lymphs: 30 %
MCH: 30.1 pg (ref 26.6–33.0)
MCHC: 32.4 g/dL (ref 31.5–35.7)
MCV: 93 fL (ref 79–97)
Monocytes Absolute: 0.5 x10E3/uL (ref 0.1–0.9)
Monocytes: 6 %
Neutrophils Absolute: 5.4 x10E3/uL (ref 1.4–7.0)
Neutrophils: 61 %
Platelets: 288 x10E3/uL (ref 150–450)
RBC: 3.92 x10E6/uL (ref 3.77–5.28)
RDW: 14 % (ref 11.7–15.4)
RPR Ser Ql: NONREACTIVE
Rh Factor: POSITIVE
Rubella Antibodies, IGG: <0.9 {index} — ABNORMAL LOW (ref >0.99)
WBC: 8.6 x10E3/uL (ref 3.4–10.8)

## 2024-01-31 LAB — HEMOGLOBIN A1C
Est. average glucose Bld gHb Est-mCnc: 103 mg/dL
Hgb A1c MFr Bld: 5.2 % (ref 4.8–5.6)

## 2024-01-31 LAB — HCV INTERPRETATION

## 2024-02-06 LAB — PANORAMA PRENATAL TEST FULL PANEL:PANORAMA TEST PLUS 5 ADDITIONAL MICRODELETIONS: FETAL FRACTION: 12.9

## 2024-02-07 LAB — HORIZON CUSTOM: REPORT SUMMARY: NEGATIVE

## 2024-02-24 ENCOUNTER — Other Ambulatory Visit: Payer: Self-pay

## 2024-02-24 ENCOUNTER — Encounter (HOSPITAL_COMMUNITY): Payer: Self-pay | Admitting: Obstetrics & Gynecology

## 2024-02-24 ENCOUNTER — Inpatient Hospital Stay (HOSPITAL_COMMUNITY)
Admission: AD | Admit: 2024-02-24 | Discharge: 2024-02-24 | Disposition: A | Discharge status: Home / Self Care | Payer: MEDICAID | Attending: Obstetrics & Gynecology | Admitting: Obstetrics & Gynecology

## 2024-02-24 DIAGNOSIS — O26852 Spotting complicating pregnancy, second trimester: Secondary | ICD-10-CM | POA: Insufficient documentation

## 2024-02-24 DIAGNOSIS — Z3A15 15 weeks gestation of pregnancy: Secondary | ICD-10-CM

## 2024-02-24 DIAGNOSIS — N920 Excessive and frequent menstruation with regular cycle: Secondary | ICD-10-CM

## 2024-02-24 DIAGNOSIS — R519 Headache, unspecified: Secondary | ICD-10-CM | POA: Diagnosis not present

## 2024-02-24 DIAGNOSIS — G43009 Migraine without aura, not intractable, without status migrainosus: Secondary | ICD-10-CM

## 2024-02-24 DIAGNOSIS — O26892 Other specified pregnancy related conditions, second trimester: Secondary | ICD-10-CM | POA: Diagnosis not present

## 2024-02-24 DIAGNOSIS — R109 Unspecified abdominal pain: Secondary | ICD-10-CM

## 2024-02-24 DIAGNOSIS — O26899 Other specified pregnancy related conditions, unspecified trimester: Secondary | ICD-10-CM

## 2024-02-24 LAB — URINALYSIS, ROUTINE W REFLEX MICROSCOPIC
Bilirubin Urine: NEGATIVE
Glucose, UA: NEGATIVE mg/dL
Hgb urine dipstick: NEGATIVE
Ketones, ur: 5 mg/dL — AB
Nitrite: NEGATIVE
Protein, ur: NEGATIVE mg/dL
Specific Gravity, Urine: 1.023 (ref 1.005–1.030)
pH: 6 (ref 5.0–8.0)

## 2024-02-24 LAB — WET PREP, GENITAL
Clue Cells Wet Prep HPF POC: NONE SEEN
Sperm: NONE SEEN
Trich, Wet Prep: NONE SEEN
WBC, Wet Prep HPF POC: >=10 — AB (ref <10)
Yeast Wet Prep HPF POC: NONE SEEN

## 2024-02-24 MED ORDER — ACETAMINOPHEN 500 MG PO TABS
1000.0000 mg | ORAL_TABLET | Freq: Once | ORAL | Status: AC
  Administered 2024-02-24: 1000 mg via ORAL
  Filled 2024-02-24: qty 2

## 2024-02-24 MED ORDER — METRONIDAZOLE 500 MG PO TABS
500.0000 mg | ORAL_TABLET | Freq: Once | ORAL | Status: AC
  Administered 2024-02-24: 500 mg via ORAL
  Filled 2024-02-24: qty 1

## 2024-02-24 MED ORDER — FLUCONAZOLE 150 MG PO TABS
150.0000 mg | ORAL_TABLET | Freq: Once | ORAL | Status: AC
  Administered 2024-02-24: 150 mg via ORAL
  Filled 2024-02-24: qty 1

## 2024-02-24 MED ORDER — METRONIDAZOLE 500 MG PO TABS: 500.0000 mg | ORAL_TABLET | Freq: Two times a day (BID) | ORAL | 0 refills | Status: DC

## 2024-02-24 NOTE — MAU Note (Addendum)
 Allison Trevino is a 18 y.o. at [redacted]w[redacted]d here in MAU reporting: lower abdominal pain that is worse on the left side that started Thursday. Has not tried taking anything for the pain. Also reports having some light spotting that started on Saturday. States recent intercourse was 2-3 weeks ago. Reports having a HA describes a migraine. Reports having a hx of migraines. Headache started Thursday as well. Has not taken anything for the pain.Denies LOF. Reports feeling some flutters.   Onset of complaint: Thursday Pain score: 10/10 lower abdominal pain and 8/10 headache Vitals:   02/24/24 2030  BP: (!) 116/58  Pulse: 80  Resp: 16  Temp: 99 F (37.2 C)  SpO2: 100%     FHT: 155  Lab orders placed from triage: urine

## 2024-02-24 NOTE — MAU Provider Note (Signed)
 History     CSN: [id number]  Arrival date and time: 02/24/24 2016   Event Date/Time   First Provider Initiated Contact with Patient 02/24/24 2124      Chief Complaint  Patient presents with   Abdominal Pain   Headache   Vaginal Bleeding   Patient is an 18 y/o G2P0010 at [redacted]w[redacted]d who presents to MAU due to spotting, some mild abdominal cramping and migraine. She states that abdominal cramping started Thursday. She denies any recent intercourse. Symptoms have never occurred before. Denies clots or leakage of fluid. Patient has a hx of migraines that started when she is 12. Typically they are aborted with tylenol  and sleep. She endorses staying hydrated and drinking plenty of fluid. She denies vision changes, chest pain or shortness of breath.     Past Medical History:  Diagnosis Date   Depression    History of suicidal ideation    IBS (irritable bowel syndrome)    Migraines    Seasonal allergies    Severe anxiety    followed by dr macarthur piety    Past Surgical History:  Procedure Laterality Date   CHOLECYSTECTOMY N/A 10/14/2023   Procedure: LAPAROSCOPIC CHOLECYSTECTOMY;  Surgeon: Belinda Cough, MD;  Location: [facility];  Service: General;  Laterality: N/A;   COLONOSCOPY N/A 10/11/2023   Procedure: COLONOSCOPY;  Surgeon: Dianna Specking, MD;  Location: [facility];  Service: Gastroenterology;  Laterality: N/A;   DILATION AND EVACUATION N/A 12/06/2022   Procedure: DILATATION AND EVACUATION;  Surgeon: Erik Kieth BROCKS, MD;  Location: [facility];  Service: Gynecology;  Laterality: N/A;    Family History  Problem Relation Age of Onset   Diabetes Mother    Healthy Father    Hypertension Neg Hx    Cancer Neg Hx     Social History   Tobacco Use   Smoking status: Never    Passive exposure: Current   Smokeless tobacco: Never  Vaping Use   Vaping status: Never Used  Substance Use Topics   Alcohol use: Never   Drug use: Not Currently    Types:  Marijuana    Comment: pos drug screen. Has not used since couple of weeks    Allergies:  Allergies  Allergen Reactions   Azithromycin  Itching and Nausea And Vomiting    Severe N/V and severe itching   Lactose Intolerance (Gi) Other (See Comments)    GI Upset   Hydromorphone  Rash    Possible allergy to opiates including oxycodone  / dilaudid  / Fentanyl . Received opiate medications (dilaudid , fentanyl ) as well as Augmentin  on 4/15 developed rash. On 4/16 received Oxycodone  and Ceftriaxone  around same time + developed recurrent rash / itching.    Medications Prior to Admission  Medication Sig Dispense Refill Last Dose/Taking   Prenatal Vit-Fe Fumarate-FA (PRENATAL VITAMIN PLUS LOW IRON PO) Take 1 tablet by mouth daily.   02/24/2024   acetaminophen  (TYLENOL ) 500 MG tablet Take 2 tablets (1,000 mg total) by mouth every 6 (six) hours as needed for mild pain (pain score 1-3).      amoxicillin  (AMOXIL ) 500 MG capsule Take 1 capsule (500 mg total) by mouth 3 (three) times daily. (Patient not taking: Reported on 01/30/2024) 21 capsule 0    aspirin  EC 81 MG tablet Take 1 tablet (81 mg total) by mouth daily. Start taking when you are [redacted] weeks pregnant for rest of pregnancy for prevention of preeclampsia 300 tablet 2    propranolol  (INDERAL ) 10 MG tablet Take 10 mg by mouth  daily as needed (anxiety).      simethicone  (MYLICON) 80 MG chewable tablet Chew 1 tablet (80 mg total) by mouth every 6 (six) hours as needed. 30 tablet 0    traMADol  (ULTRAM ) 50 MG tablet Take 0.5 tablets (25 mg total) by mouth every 6 (six) hours as needed for severe pain (pain score 7-10). (Patient not taking: Reported on 01/30/2024) 10 tablet 0     Review of Systems Physical Exam   Blood pressure (!) 111/57, pulse 80, temperature 99 F (37.2 C), temperature source Oral, resp. rate 16, height 4' 11 (1.499 m), weight 48.9 kg, last menstrual period 11/08/2023, SpO2 97%.  Physical Exam Vitals and nursing note reviewed. Exam  conducted with a chaperone present.  Constitutional:      Appearance: She is well-developed.  HENT:     Head: Normocephalic and atraumatic.     Mouth/Throat:     Mouth: Mucous membranes are moist.  Eyes:     Extraocular Movements: Extraocular movements intact.  Cardiovascular:     Rate and Rhythm: Normal rate and regular rhythm.  Pulmonary:     Effort: Pulmonary effort is normal.  Abdominal:     Palpations: Abdomen is soft.     Tenderness: There is generalized abdominal tenderness.  Genitourinary:    General: Normal vulva.     Exam position: Lithotomy position.     Pubic Area: No rash.      Vagina: Vaginal discharge present. No bleeding.     Cervix: Normal. No discharge or friability.  Skin:    Capillary Refill: Capillary refill takes less than 2 seconds.  Neurological:     General: No focal deficit present.     Mental Status: She is alert.     MAU Course  Procedures  MDM low  Assessment and Plan   Patient is an 18 y/o G2P0010 at [redacted]w[redacted]d who presents to MAU due to spotting, some mild abdominal cramping and migraine.  -Wet prep showed no clue cells or yeast but >10 WBCs. U/A showed many bacteria with WBC's present. -Pelvic exam concerning for BV vs candidiasis although labs showed no yeast or clue cells. Cervix is closed without discharge or bleeding.   -Safe to discharge home. -Given dose of diflucan  in MAU to treat yeast. Given first dose of metronidazole  due to time of night. Rest of 5 day course sent to patient pharmacy. -Ordered urine culture off U/A, results pending.  -All questions answered, anticipatory guidance and detailed return precautions provided.   Rebekah Zackery L Kalla Watson 02/24/2024, 10:51 PM

## 2024-02-26 LAB — GC/CHLAMYDIA PROBE AMP (~~LOC~~) NOT AT ARMC
Chlamydia: NEGATIVE
Comment: NEGATIVE
Comment: NORMAL
Neisseria Gonorrhea: NEGATIVE

## 2024-02-26 LAB — CULTURE, OB URINE: Culture: NO GROWTH

## 2024-02-27 ENCOUNTER — Ambulatory Visit (INDEPENDENT_AMBULATORY_CARE_PROVIDER_SITE_OTHER): Payer: MEDICAID | Admitting: Advanced Practice Midwife

## 2024-02-27 VITALS — Wt 104.4 lb

## 2024-02-27 DIAGNOSIS — R1032 Left lower quadrant pain: Secondary | ICD-10-CM | POA: Diagnosis not present

## 2024-02-27 DIAGNOSIS — Z3A15 15 weeks gestation of pregnancy: Secondary | ICD-10-CM | POA: Diagnosis not present

## 2024-02-27 DIAGNOSIS — R1031 Right lower quadrant pain: Secondary | ICD-10-CM | POA: Diagnosis not present

## 2024-02-27 MED ORDER — CLINDAMYCIN PHOSPHATE 2 % VA CREA: 1.0000 | TOPICAL_CREAM | Freq: Every day | VAGINAL | 0 refills | Status: DC

## 2024-02-27 MED ORDER — DIPHENHYDRAMINE HCL 50 MG PO TABS: 25.0000 mg | ORAL_TABLET | Freq: Three times a day (TID) | ORAL | 0 refills | Status: DC | PRN

## 2024-02-27 NOTE — Progress Notes (Signed)
 Pt presents for ROB visit. No bleeding since MAU visit 02-24-24. Pt c/o itching all over, rash on right arm and congestion. Pt has safe med list

## 2024-02-27 NOTE — Progress Notes (Signed)
   PRENATAL VISIT NOTE  Subjective:  Allison Trevino is a 18 y.o. G2P0010 at [redacted]w[redacted]d being seen today for ongoing prenatal care.  She is currently monitored for the following issues for this low-risk pregnancy and has Abnormal involuntary movement; Anxiety state; Insomnia; Nausea & vomiting; Pancolitis (HCC); Acute anemia; Acute colitis; Supervision of other normal pregnancy, antepartum; and Rubella non-immune status, antepartum on their problem list.  Patient reports lower abdominal cramping that has continued since Sunday.  Contractions: Irritability. Vag. Bleeding: None.  Movement: Present. Denies leaking of fluid.   The following portions of the patient's history were reviewed and updated as appropriate: allergies, current medications, past family history, past medical history, past social history, past surgical history and problem list.   Objective:    Vitals:   02/27/24 1413  Weight: 104 lb 6.4 oz (47.4 kg)    Fetal Status:  Fetal Heart Rate (bpm): 151   Movement: Present    General: Alert, oriented and cooperative. Patient is in no acute distress.  Skin: Skin is warm and dry. No rash noted.   Cardiovascular: Normal heart rate noted  Respiratory: Normal respiratory effort, no problems with respiration noted  Abdomen: Soft, gravid, appropriate for gestational age.  Pain/Pressure: Present     Pelvic: Cervical exam deferred        Extremities: Normal range of motion.  Edema: None  Mental Status: Normal mood and affect. Normal behavior. Normal judgment and thought content.   Assessment and Plan:  Pregnancy: G2P0010 at [redacted]w[redacted]d presenting for a routine prenatal visit.  1. [redacted] weeks gestation of pregnancy (Primary) - Discussed abdominal cramping, see problem below for assessment & plan - Open to AFP but didn't eat prior to appointment, would like to defer test at this time to the next appointment - AFP, Serum, Open Spina Bifida  2. Abdominal cramping, bilateral lower quadrant -  Few days of lower abdominal cramping, was spotting in the MAU on Sunday but denies spotting at this time, not taking pain medications at this time - Recently given metronidazole  for presumed bacterial vaginosis infection, has had diffuse itching since starting this medication, scratching consistently throughout appointment - Discussed that lower abdominal cramping could be multifactorial, related to worsening vaginal infection, pelvic strain due to work, round ligament pain - Plan to discontinue metronidazole  and switch to clindamycin  cream, given prescription for Benadryl  for itching, recommended Tylenol  and warm compresses for pain management - Given work excuse letter for pelvic rest for 1 week - Counseled extensively on preterm labor symptoms, can call clinic or go to the MAU for further management   Preterm labor symptoms and general obstetric precautions including but not limited to vaginal bleeding, contractions, leaking of fluid and fetal movement were reviewed in detail with the patient. Please refer to After Visit Summary for other counseling recommendations.   Return in about 4 weeks (around 03/26/2024) for Routine prenatal visit.  Future Appointments  Date Time Provider Department Center  03/24/2024 10:15 AM Corinthia Blossom, MD [facility]  03/24/2024  1:00 PM [facility] PROVIDER 1 [facility]  03/24/2024  1:30 PM [facility] US1 [facility]  03/26/2024  2:50 PM Delores Nidia CROME, FNP [facility] None    Charlie DELENA Courts, MD

## 2024-03-02 ENCOUNTER — Other Ambulatory Visit: Payer: Self-pay

## 2024-03-02 ENCOUNTER — Inpatient Hospital Stay (HOSPITAL_COMMUNITY): Payer: MEDICAID

## 2024-03-02 ENCOUNTER — Inpatient Hospital Stay (HOSPITAL_COMMUNITY)
Admission: AD | Admit: 2024-03-02 | Discharge: 2024-03-03 | Disposition: A | Discharge status: Home / Self Care | Payer: MEDICAID | Attending: Obstetrics & Gynecology | Admitting: Obstetrics & Gynecology

## 2024-03-02 DIAGNOSIS — Z3A16 16 weeks gestation of pregnancy: Secondary | ICD-10-CM | POA: Insufficient documentation

## 2024-03-02 DIAGNOSIS — O26892 Other specified pregnancy related conditions, second trimester: Secondary | ICD-10-CM | POA: Insufficient documentation

## 2024-03-02 DIAGNOSIS — R109 Unspecified abdominal pain: Secondary | ICD-10-CM | POA: Diagnosis not present

## 2024-03-02 LAB — URINALYSIS, ROUTINE W REFLEX MICROSCOPIC
Bilirubin Urine: NEGATIVE
Glucose, UA: NEGATIVE mg/dL
Hgb urine dipstick: NEGATIVE
Ketones, ur: NEGATIVE mg/dL
Leukocytes,Ua: NEGATIVE
Nitrite: NEGATIVE
Protein, ur: NEGATIVE mg/dL
Specific Gravity, Urine: 1.018 (ref 1.005–1.030)
pH: 7 (ref 5.0–8.0)

## 2024-03-02 NOTE — MAU Provider Note (Signed)
 History     [id number]  Arrival date and time: 03/02/24 2128    Chief Complaint  Patient presents with  . Abdominal Pain     HPI Allison Trevino is a 18 y.o. at [redacted]w[redacted]d by LMP c/w 7 wk US  with PMHx notable for s/p cholecystectomy in 09/2023, who presents for abdominal pain.    Patient reports R sided pelvic pain that started a few hours ago Pain is mostly constant Also having some R lower back pain, but does not feel like it is wrapping around from back to front or like its shooting from back to front Took tylenol  a few hours ago but it did not help No bleeding or leaking fluid No vaginal pain or itching but endorses white clumpy discharge Has never had similar pain before Was seen in MAU a week ago for spotting and cramping as well as in the office four days ago, this pain is different  Reports that prior it was more pressure and that now it is more like a stabbing pain She is worried if these might be contraction pains Does endorse some sharp pain with urination in her abdomen but none in her vulva Brother has had kidney stones, no other family or personal hx of them that she is aware of  O/Positive/-- (08/06 1009)  OB History     Gravida  2   Para  0   Term  0   Preterm  0   AB  1   Living  0      SAB  1   IAB  0   Ectopic  0   Multiple  0   Live Births  0           Past Medical History:  Diagnosis Date  . Depression   . History of suicidal ideation   . IBS (irritable bowel syndrome)   . Migraines   . Seasonal allergies   . Severe anxiety    followed by dr macarthur piety    Past Surgical History:  Procedure Laterality Date  . CHOLECYSTECTOMY N/A 10/14/2023   Procedure: LAPAROSCOPIC CHOLECYSTECTOMY;  Surgeon: Belinda Cough, MD;  Location: [facility];  Service: General;  Laterality: N/A;  . COLONOSCOPY N/A 10/11/2023   Procedure: COLONOSCOPY;  Surgeon: Dianna Specking, MD;  Location: [facility];  Service: Gastroenterology;   Laterality: N/A;  . DILATION AND EVACUATION N/A 12/06/2022   Procedure: DILATATION AND EVACUATION;  Surgeon: Erik Kieth BROCKS, MD;  Location: [facility];  Service: Gynecology;  Laterality: N/A;    Family History  Problem Relation Age of Onset  . Diabetes Mother   . Healthy Father   . Hypertension Neg Hx   . Cancer Neg Hx     Social History   Socioeconomic History  . Marital status: Single    Spouse name: Not on file  . Number of children: Not on file  . Years of education: Not on file  . Highest education level: 12th grade  Occupational History  . Not on file  Tobacco Use  . Smoking status: Never    Passive exposure: Current  . Smokeless tobacco: Never  Vaping Use  . Vaping status: Never Used  Substance and Sexual Activity  . Alcohol use: Never  . Drug use: Not Currently    Types: Marijuana    Comment: pos drug screen. Has not used since couple of weeks  . Sexual activity: Yes  Other Topics Concern  . Not on file  Social  History Narrative   Ellanora graduated from [employer] May 2024   She lives with both parents, four siblings,  she is the youngest   Social Drivers of Corporate investment banker Strain: Not on file  Food Insecurity: No Food Insecurity (10/10/2023)   Hunger Vital Sign   . Worried About Programme researcher, broadcasting/film/video in the Last Year: Never true   . Ran Out of Food in the Last Year: Never true  Transportation Needs: No Transportation Needs (10/10/2023)   PRAPARE - Transportation   . Lack of Transportation (Medical): No   . Lack of Transportation (Non-Medical): No  Physical Activity: Not on file  Stress: Not on file  Social Connections: Not on file  Intimate Partner Violence: Not At Risk (10/10/2023)   Humiliation, Afraid, Rape, and Kick questionnaire   . Fear of Current or Ex-Partner: No   . Emotionally Abused: No   . Physically Abused: No   . Sexually Abused: No    Allergies  Allergen Reactions  . Azithromycin  Itching and  Nausea And Vomiting    Severe N/V and severe itching  . Lactose Intolerance (Gi) Other (See Comments)    GI Upset  . Metronidazole  Itching  . Hydromorphone  Rash    Possible allergy to opiates including oxycodone  / dilaudid  / Fentanyl . Received opiate medications (dilaudid , fentanyl ) as well as Augmentin  on 4/15 developed rash. On 4/16 received Oxycodone  and Ceftriaxone  around same time + developed recurrent rash / itching.    No current facility-administered medications on file prior to encounter.   Current Outpatient Medications on File Prior to Encounter  Medication Sig Dispense Refill  . acetaminophen  (TYLENOL ) 500 MG tablet Take 2 tablets (1,000 mg total) by mouth every 6 (six) hours as needed for mild pain (pain score 1-3).    . aspirin  EC 81 MG tablet Take 1 tablet (81 mg total) by mouth daily. Start taking when you are [redacted] weeks pregnant for rest of pregnancy for prevention of preeclampsia 300 tablet 2  . clindamycin  (CLEOCIN ) 2 % vaginal cream Place 1 Applicatorful vaginally at bedtime. 40 g 0  . diphenhydrAMINE  (BENADRYL ) 50 MG tablet Take 0.5 tablets (25 mg total) by mouth every 8 (eight) hours as needed for itching. 10 tablet 0  . Prenatal Vit-Fe Fumarate-FA (PRENATAL VITAMIN PLUS LOW IRON PO) Take 1 tablet by mouth daily.    . propranolol  (INDERAL ) 10 MG tablet Take 10 mg by mouth daily as needed (anxiety).    . simethicone  (MYLICON) 80 MG chewable tablet Chew 1 tablet (80 mg total) by mouth every 6 (six) hours as needed. 30 tablet 0     ROS Pertinent positives and negative per HPI, all others reviewed and negative  Physical Exam   BP 110/65 (BP Location: Right Arm)   Pulse 98   Temp 99.1 F (37.3 C) (Oral)   Resp 18   Ht 4' 11 (1.499 m)   Wt 48.7 kg   LMP 11/08/2023 (Exact Date)   SpO2 100%   BMI 21.67 kg/m   Patient Vitals for the past 24 hrs:  BP Temp Temp src Pulse Resp SpO2 Height Weight  03/02/24 2150 110/65 99.1 F (37.3 C) Oral 98 18 100 % 4' 11 (1.499  m) 48.7 kg    Physical Exam Vitals reviewed.  Constitutional:      General: She is not in acute distress.    Appearance: She is well-developed. She is not diaphoretic.  Eyes:     General: No scleral icterus.  Pulmonary:     Effort: Pulmonary effort is normal. No respiratory distress.  Abdominal:     General: There is no distension.     Palpations: Abdomen is soft.     Tenderness: There is no abdominal tenderness. There is no guarding or rebound.  Genitourinary:    Comments: Cervix long and closed Skin:    General: Skin is warm and dry.  Neurological:     Mental Status: She is alert.     Coordination: Coordination normal.      Cervical Exam    Bedside Ultrasound Pt informed that the ultrasound is considered a limited OB ultrasound and is not intended to be a complete ultrasound exam.  Patient also informed that the ultrasound is not being completed with the intent of assessing for fetal or placental anomalies or any pelvic abnormalities.  Explained that the purpose of today's ultrasound is to assess for  viability.  Patient acknowledges the purpose of the exam and the limitations of the study.      My interpretation: viable IUP seen with subjectively normal AFI and cardiac activity as well as normal somatic movement.  FHT 150 bpm by doppler  Labs Results for orders placed or performed during the hospital encounter of 03/02/24 (from the past 24 hours)  Urinalysis, Routine w reflex microscopic -Urine, Clean Catch     Status: Abnormal   Collection Time: 03/02/24 11:00 PM  Result Value Ref Range   Color, Urine YELLOW YELLOW   APPearance HAZY (A) CLEAR   Specific Gravity, Urine 1.018 1.005 - 1.030   pH 7.0 5.0 - 8.0   Glucose, UA NEGATIVE NEGATIVE mg/dL   Hgb urine dipstick NEGATIVE NEGATIVE   Bilirubin Urine NEGATIVE NEGATIVE   Ketones, ur NEGATIVE NEGATIVE mg/dL   Protein, ur NEGATIVE NEGATIVE mg/dL   Nitrite NEGATIVE NEGATIVE   Leukocytes,Ua NEGATIVE NEGATIVE     Imaging No results found.  MAU Course  Procedures Lab Orders         Culture, OB Urine         Urinalysis, Routine w reflex microscopic -Urine, Clean Catch    No orders of the defined types were placed in this encounter.  Imaging Orders         US  RENAL      MDM Moderate (Level 3-4)  Assessment and Plan  #Abdominal pain in pregnancy, second trimester #[redacted] weeks gestation of pregnancy ***  #FWB 150 bpm by doppler   Dispo: {MAU disposition:29429}  ***add GA to billing    Donnice CHRISTELLA Carolus, MD/MPH 03/02/24 11:38 PM  Allergies as of 03/02/2024       Reactions   Azithromycin  Itching, Nausea And Vomiting   Severe N/V and severe itching   Lactose Intolerance (gi) Other (See Comments)   GI Upset   Metronidazole  Itching   Hydromorphone  Rash   Possible allergy to opiates including oxycodone  / dilaudid  / Fentanyl . Received opiate medications (dilaudid , fentanyl ) as well as Augmentin  on 4/15 developed rash. On 4/16 received Oxycodone  and Ceftriaxone  around same time + developed recurrent rash / itching.     Med Rec must be completed prior to using this [facility]***

## 2024-03-02 NOTE — MAU Note (Signed)
 Allison Trevino is a 18 y.o. at [redacted]w[redacted]d here in MAU reporting: an hour ago was getting up to use there restroom - felt a lot of pressure and stabbing pain in right lower abdomen. Laid down and felt the pain again - has been constant. Was seen a few days ago and was told to come back if she wasn't feeling better. Taking medication for BV. Denies VB. Reports white discharge clumpy I guess - no odor. Denies vaginal irritation or itching. Took tylenol  it doesn't help  LMP: NA Onset of complaint: 2045 Pain score: 10 Vitals:   03/02/24 2150  BP: 110/65  Pulse: 98  Resp: 18  Temp: 99.1 F (37.3 C)  SpO2: 100%     FHT: 150  Lab orders placed from triage: UA

## 2024-03-03 DIAGNOSIS — O26892 Other specified pregnancy related conditions, second trimester: Secondary | ICD-10-CM

## 2024-03-03 DIAGNOSIS — Z3A16 16 weeks gestation of pregnancy: Secondary | ICD-10-CM

## 2024-03-03 DIAGNOSIS — R109 Unspecified abdominal pain: Secondary | ICD-10-CM

## 2024-03-03 NOTE — Discharge Instructions (Addendum)
 You were seen in the MAU for abdominal pain. We are unable to find the cause of this pain. If the pain does not get better, or especially if it gets significantly worse, if you develop a fever, or if you develop severe nausea and vomiting, return to the MAU immediately for re-evaluation.

## 2024-03-04 ENCOUNTER — Ambulatory Visit: Payer: Self-pay | Admitting: Family Medicine

## 2024-03-05 LAB — CULTURE, OB URINE: Culture: 30000 — AB

## 2024-03-09 ENCOUNTER — Encounter: Payer: Self-pay | Admitting: Advanced Practice Midwife

## 2024-03-24 ENCOUNTER — Ambulatory Visit (INDEPENDENT_AMBULATORY_CARE_PROVIDER_SITE_OTHER): Payer: MEDICAID | Admitting: Neurology

## 2024-03-24 ENCOUNTER — Ambulatory Visit: Payer: MEDICAID | Attending: Obstetrics and Gynecology | Admitting: Obstetrics

## 2024-03-24 ENCOUNTER — Ambulatory Visit: Payer: MEDICAID

## 2024-03-24 ENCOUNTER — Other Ambulatory Visit: Payer: Self-pay | Admitting: Obstetrics and Gynecology

## 2024-03-24 ENCOUNTER — Other Ambulatory Visit: Payer: Self-pay | Admitting: *Deleted

## 2024-03-24 ENCOUNTER — Encounter (INDEPENDENT_AMBULATORY_CARE_PROVIDER_SITE_OTHER): Payer: Self-pay | Admitting: Neurology

## 2024-03-24 ENCOUNTER — Encounter (HOSPITAL_BASED_OUTPATIENT_CLINIC_OR_DEPARTMENT_OTHER): Payer: Self-pay | Admitting: Obstetrics & Gynecology

## 2024-03-24 VITALS — BP 116/57 | HR 83

## 2024-03-24 DIAGNOSIS — R825 Elevated urine levels of drugs, medicaments and biological substances: Secondary | ICD-10-CM | POA: Diagnosis not present

## 2024-03-24 DIAGNOSIS — O3503X0 Maternal care for (suspected) central nervous system malformation or damage in fetus, choroid plexus cysts, not applicable or unspecified: Secondary | ICD-10-CM

## 2024-03-24 DIAGNOSIS — O99342 Other mental disorders complicating pregnancy, second trimester: Secondary | ICD-10-CM

## 2024-03-24 DIAGNOSIS — Z362 Encounter for other antenatal screening follow-up: Secondary | ICD-10-CM

## 2024-03-24 DIAGNOSIS — Z3480 Encounter for supervision of other normal pregnancy, unspecified trimester: Secondary | ICD-10-CM

## 2024-03-24 DIAGNOSIS — Z36 Encounter for antenatal screening for chromosomal anomalies: Secondary | ICD-10-CM | POA: Diagnosis not present

## 2024-03-24 DIAGNOSIS — O26892 Other specified pregnancy related conditions, second trimester: Secondary | ICD-10-CM | POA: Diagnosis not present

## 2024-03-24 DIAGNOSIS — Z3A19 19 weeks gestation of pregnancy: Secondary | ICD-10-CM | POA: Diagnosis not present

## 2024-03-24 DIAGNOSIS — O09892 Supervision of other high risk pregnancies, second trimester: Secondary | ICD-10-CM

## 2024-03-24 NOTE — Progress Notes (Signed)
 MFM Consult Note  Allison Trevino is currently at 19 weeks and 4 days.  She was seen for a detailed fetal anatomy scan due to a teenage pregnancy.  She denies any significant past medical history and denies any problems in her current pregnancy.    She had a cell free DNA test earlier in her pregnancy which indicated a low risk for trisomy 27, 37, and 13. A female fetus is predicted.   Sonographic findings Single intrauterine pregnancy at 19w 4d  Fetal cardiac activity:  Observed and appears normal. Presentation: Cephalic. The anatomic structures that were well seen appear normal.  Bilateral choroid plexus cysts were noted on today's exam.  Due to poor acoustic windows some structures remain suboptimally visualized. Fetal biometry shows the estimated fetal weight at the 72 percentile.  Amniotic fluid: Within normal limits.  MVP: 4.59 cm. Placenta: Posterior. Adnexa: No abnormality visualized. Cervical length: 3 cm.  The patient was informed that anomalies may be missed due to technical limitations. If the fetus is in a suboptimal position or maternal habitus is increased, visualization of the fetus in the maternal uterus may be impaired.  Bilateral choroid plexus cysts Bilateral choroid plexus cysts were noted in the fetal head.   She was advised that the choroid plexus cysts are most likely normal variants and will usually resolve at around 24 weeks.   The small association of bilateral choroid plexus cysts with trisomy 18 was discussed.  She was advised that as no other anomalies were noted on today's ultrasound exam, it is highly unlikely that her fetus has trisomy 78.   Due to the small association between choroid plexus cysts and trisomy 18, she was offered and declined an amniocentesis today for definitive diagnosis of fetal aneuploidy.  She was comfortable with the low risk indicated by her cell free DNA test.    A follow-up exam was scheduled in 5 weeks to assess the fetal  growth and to follow-up the bilateral CP cysts.    The patient and her mother stated that all of their questions were answered today.  A total of 30 minutes was spent counseling and coordinating the care for this patient.  Greater than 50% of the time was spent in direct face-to-face contact.

## 2024-03-26 ENCOUNTER — Encounter: Payer: Self-pay | Admitting: Obstetrics and Gynecology

## 2024-03-26 ENCOUNTER — Ambulatory Visit: Payer: MEDICAID | Admitting: Obstetrics and Gynecology

## 2024-03-26 VITALS — BP 103/61 | HR 61 | Wt 110.0 lb

## 2024-03-26 DIAGNOSIS — L299 Pruritus, unspecified: Secondary | ICD-10-CM | POA: Diagnosis not present

## 2024-03-26 DIAGNOSIS — Z3480 Encounter for supervision of other normal pregnancy, unspecified trimester: Secondary | ICD-10-CM | POA: Diagnosis not present

## 2024-03-26 DIAGNOSIS — Z3A19 19 weeks gestation of pregnancy: Secondary | ICD-10-CM

## 2024-03-26 NOTE — Progress Notes (Unsigned)
   PRENATAL VISIT NOTE  Subjective:  Allison Trevino is a 18 y.o. G2P0010 at [redacted]w[redacted]d being seen today for ongoing prenatal care.  She is currently monitored for the following issues for this low-risk pregnancy and has High risk teen pregnancy; Nausea & vomiting; Pancolitis; Acute anemia; Acute colitis; Supervision of other normal pregnancy, antepartum; Rubella non-immune status, antepartum; and Choroid plexus cyst of fetus in singleton pregnancy on their problem list.  Patient reports reports itching all over body, denies rash, symptoms throughout the day not worse a specific timeframe. She cannot take hydroxyzine  due to adverse affects on it previously   Contractions: Not present. Vag. Bleeding: None.  Movement: Present. Denies leaking of fluid.   The following portions of the patient's history were reviewed and updated as appropriate: allergies, current medications, past family history, past medical history, past social history, past surgical history and problem list.   Objective:    Vitals:   03/26/24 1455  BP: 103/61  Pulse: 61  Weight: 110 lb (49.9 kg)    Fetal Status:  Fetal Heart Rate (bpm): 139   Movement: Present    General: Alert, oriented and cooperative. Patient is in no acute distress.  Skin: Skin is warm and dry. No rash noted.   Cardiovascular: Normal heart rate noted  Respiratory: Normal respiratory effort, no problems with respiration noted  Abdomen: Soft, gravid, appropriate for gestational age.  Pain/Pressure: Absent     Pelvic: Cervical exam deferred        Extremities: Normal range of motion.  Edema: None  Mental Status: Normal mood and affect. Normal behavior. Normal judgment and thought content.   Assessment and Plan:  Pregnancy: G2P0010 at [redacted]w[redacted]d 1. Supervision of other normal pregnancy, antepartum (Primary) BP and FHR normal Doing well, feeling regular movement    2. [redacted] weeks gestation of pregnancy 9/29 , normal growth, follow up to complete anatomy  and follow up CPC Repeat urine culture today  Peds list provided  - AFP, Serum, Open Spina Bifida  3. Pruritus Checking lab work Discussed antihistamine and calamine Follow up if symptoms worsen or do not resolve  Discussed with md in office  - Comp Met (CMET) - Bile acids, total  Preterm labor symptoms and general obstetric precautions including but not limited to vaginal bleeding, contractions, leaking of fluid and fetal movement were reviewed in detail with the patient. Please refer to After Visit Summary for other counseling recommendations.   Return in about 4 weeks (around 04/23/2024) for OB VISIT (MD or APP).  Future Appointments  Date Time Provider Department Center  04/28/2024  1:15 PM [facility] PROVIDER 1 [facility]  04/28/2024  1:30 PM [facility] US2 [facility]    Nidia Daring, FNP

## 2024-03-26 NOTE — Progress Notes (Unsigned)
 Pt presents for ROB visit. No concerns

## 2024-03-30 ENCOUNTER — Ambulatory Visit: Payer: Self-pay | Admitting: Obstetrics and Gynecology

## 2024-03-30 DIAGNOSIS — Z3480 Encounter for supervision of other normal pregnancy, unspecified trimester: Secondary | ICD-10-CM

## 2024-03-31 LAB — COMPREHENSIVE METABOLIC PANEL WITH GFR
ALT: 13 IU/L (ref 0–32)
AST: 16 IU/L (ref 0–40)
Albumin: 3.7 g/dL — ABNORMAL LOW (ref 4.0–5.0)
Alkaline Phosphatase: 72 IU/L (ref 42–106)
BUN/Creatinine Ratio: 18 (ref 9–23)
BUN: 7 mg/dL (ref 6–20)
Bilirubin Total: 0.3 mg/dL (ref 0.0–1.2)
CO2: 18 mmol/L — ABNORMAL LOW (ref 20–29)
Calcium: 8.9 mg/dL (ref 8.7–10.2)
Chloride: 102 mmol/L (ref 96–106)
Creatinine, Ser: 0.39 mg/dL — ABNORMAL LOW (ref 0.57–1.00)
Globulin, Total: 2.7 g/dL (ref 1.5–4.5)
Glucose: 71 mg/dL (ref 70–99)
Potassium: 3.9 mmol/L (ref 3.5–5.2)
Sodium: 135 mmol/L (ref 134–144)
Total Protein: 6.4 g/dL (ref 6.0–8.5)
eGFR: 148 mL/min/1.73 (ref >59)

## 2024-03-31 LAB — AFP, SERUM, OPEN SPINA BIFIDA
AFP MoM: 1
AFP Value: 60.2 ng/mL
Gest. Age on Collection Date: 19 wk
Maternal Age At EDD: 19.3 a
OSBR Risk 1 IN: 10000
Weight: 110 [lb_av]

## 2024-03-31 LAB — BILE ACIDS, TOTAL: Bile Acids Total: 6.2 umol/L (ref 0.0–10.0)

## 2024-04-04 ENCOUNTER — Telehealth: Payer: Self-pay

## 2024-04-04 NOTE — Telephone Encounter (Signed)
 Her bile acids are normal, has she tried topical cream and antihistamine discussed last visit? Are we able to get repeat blood work on her today? If not I might suggest if symptoms are worsening she go to the hospital for evaluation and blood work?

## 2024-04-04 NOTE — Telephone Encounter (Signed)
 routed

## 2024-04-10 ENCOUNTER — Telehealth: Payer: Self-pay

## 2024-04-10 ENCOUNTER — Encounter: Payer: Self-pay | Admitting: Advanced Practice Midwife

## 2024-04-10 NOTE — Telephone Encounter (Signed)
 S/w pt to f/u about mychart message concerning feeling faint. Pt states that she does not feel any abnormal symptoms today and that it first occurred on Tuesday. She said that she previously had been running around and then sat in a car for a while and got hot. Advised to increase fluids, eat regular meals, snacks, change positions slowly, lay on side, avoid prolonged standing and extreme heat temperatures. Advised that of symptoms return make an earlier appt to be evaluated or if sever be evaluated at the hospital.

## 2024-04-23 ENCOUNTER — Ambulatory Visit: Payer: MEDICAID | Admitting: Obstetrics and Gynecology

## 2024-04-23 VITALS — BP 109/70 | HR 79 | Wt 124.0 lb

## 2024-04-23 DIAGNOSIS — D649 Anemia, unspecified: Secondary | ICD-10-CM | POA: Diagnosis not present

## 2024-04-23 DIAGNOSIS — Z3A23 23 weeks gestation of pregnancy: Secondary | ICD-10-CM

## 2024-04-23 DIAGNOSIS — Z3480 Encounter for supervision of other normal pregnancy, unspecified trimester: Secondary | ICD-10-CM

## 2024-04-23 DIAGNOSIS — Z23 Encounter for immunization: Secondary | ICD-10-CM

## 2024-04-23 DIAGNOSIS — O3503X0 Maternal care for (suspected) central nervous system malformation or damage in fetus, choroid plexus cysts, not applicable or unspecified: Secondary | ICD-10-CM

## 2024-04-23 DIAGNOSIS — Z3490 Encounter for supervision of normal pregnancy, unspecified, unspecified trimester: Secondary | ICD-10-CM

## 2024-04-23 DIAGNOSIS — R55 Syncope and collapse: Secondary | ICD-10-CM | POA: Diagnosis not present

## 2024-04-23 DIAGNOSIS — Z2839 Other underimmunization status: Secondary | ICD-10-CM

## 2024-04-23 NOTE — Progress Notes (Signed)
 Pt states she has been seeing floaters x couple weeks, denies HA's.

## 2024-04-23 NOTE — Addendum Note (Signed)
 Addended by: JERRYE AREA D on: 04/23/2024 02:47 PM   Modules accepted: Orders

## 2024-04-23 NOTE — Progress Notes (Signed)
   PRENATAL VISIT NOTE  Subjective:  Allison Trevino is a 18 y.o. G2P0010 at [redacted]w[redacted]d being seen today for ongoing prenatal care.  She is currently monitored for the following issues for this low-risk pregnancy and has High risk teen pregnancy; Supervision of other normal pregnancy, antepartum; Rubella non-immune status, antepartum; and Choroid plexus cyst of fetus in singleton pregnancy on their problem list.  Patient reports episodes of shortness of breath and dizziness occurring even at rest. No associated chest pain or palpitations. Floaters. Continues to have itching on her belly, not on palms or soles. Interested in eIOL at 39 weeks.  Contractions: Not present. Vag. Bleeding: None.  Movement: Present. Denies leaking of fluid.   The following portions of the patient's history were reviewed and updated as appropriate: allergies, current medications, past family history, past medical history, past social history, past surgical history and problem list.   Objective:   Vitals:   04/23/24 1342  BP: 109/70  Pulse: 79  Weight: 124 lb (56.2 kg)   Fetal Status: Fetal Heart Rate (bpm): 150   Movement: Present     General:  Alert, oriented and cooperative. Patient is in no acute distress.  Skin: Skin is warm and dry. No rash noted.   Cardiovascular: Normal heart rate noted  Respiratory: Normal respiratory effort, no problems with respiration noted  Abdomen: Soft, gravid, appropriate for gestational age.  Pain/Pressure: Absent      Assessment and Plan:  Pregnancy: G2P0010 at [redacted]w[redacted]d 1. Supervision of other normal pregnancy, antepartum (Primary) 2. [redacted] weeks gestation of pregnancy See below Third tri labs next visit Flu shot today  3. Rubella non-immune status, antepartum PP MMR  4. Choroid plexus cyst of fetus in singleton pregnancy LR NIPS F/u US  scheduled 11/3  5. Acute anemia NOB Hgb 11.8, will remove from problem list  6. Pre syncope Encouraged to keep log of  symptoms Normal CMP last visit Repeat CBC today EKG ordered Consider cardiology referral if no improvement next visit  Please refer to After Visit Summary for other counseling recommendations.   Return in about 4 weeks (around 05/21/2024) for return OB at 27-28 weeks with 2h GTT, CBC, HIV/RPR, and tdap.  Future Appointments  Date Time Provider Department Center  04/28/2024  1:15 PM [facility] PROVIDER 1 [facility]  04/28/2024  1:30 PM [facility] US2 [facility]  05/21/2024  1:50 PM Rudy Carlin LABOR, MD [facility] None   Kieth JAYSON Carolin, MD

## 2024-04-24 ENCOUNTER — Ambulatory Visit: Payer: Self-pay | Admitting: Obstetrics and Gynecology

## 2024-04-24 DIAGNOSIS — D649 Anemia, unspecified: Secondary | ICD-10-CM | POA: Insufficient documentation

## 2024-04-24 LAB — CBC
Hematocrit: 29.2 % — ABNORMAL LOW (ref 34.0–46.6)
Hemoglobin: 9.5 g/dL — ABNORMAL LOW (ref 11.1–15.9)
MCH: 31.3 pg (ref 26.6–33.0)
MCHC: 32.5 g/dL (ref 31.5–35.7)
MCV: 96 fL (ref 79–97)
Platelets: 248 x10E3/uL (ref 150–450)
RBC: 3.04 x10E6/uL — ABNORMAL LOW (ref 3.77–5.28)
RDW: 12.5 % (ref 11.7–15.4)
WBC: 8.6 x10E3/uL (ref 3.4–10.8)

## 2024-04-24 MED ORDER — FERROUS SULFATE 325 (65 FE) MG PO TABS: 325.0000 mg | ORAL_TABLET | ORAL | 1 refills | Status: DC

## 2024-04-28 ENCOUNTER — Ambulatory Visit: Payer: MEDICAID | Attending: Obstetrics and Gynecology

## 2024-04-28 ENCOUNTER — Other Ambulatory Visit: Payer: Self-pay | Admitting: *Deleted

## 2024-04-28 ENCOUNTER — Ambulatory Visit (HOSPITAL_BASED_OUTPATIENT_CLINIC_OR_DEPARTMENT_OTHER): Payer: MEDICAID | Admitting: Maternal & Fetal Medicine

## 2024-04-28 ENCOUNTER — Ambulatory Visit: Payer: MEDICAID

## 2024-04-28 VITALS — BP 116/64 | HR 75

## 2024-04-28 DIAGNOSIS — L299 Pruritus, unspecified: Secondary | ICD-10-CM | POA: Insufficient documentation

## 2024-04-28 DIAGNOSIS — Z3480 Encounter for supervision of other normal pregnancy, unspecified trimester: Secondary | ICD-10-CM | POA: Diagnosis present

## 2024-04-28 DIAGNOSIS — O26893 Other specified pregnancy related conditions, third trimester: Secondary | ICD-10-CM | POA: Diagnosis not present

## 2024-04-28 DIAGNOSIS — O99712 Diseases of the skin and subcutaneous tissue complicating pregnancy, second trimester: Secondary | ICD-10-CM | POA: Diagnosis not present

## 2024-04-28 DIAGNOSIS — Z3A24 24 weeks gestation of pregnancy: Secondary | ICD-10-CM

## 2024-04-28 DIAGNOSIS — O3503X0 Maternal care for (suspected) central nervous system malformation or damage in fetus, choroid plexus cysts, not applicable or unspecified: Secondary | ICD-10-CM | POA: Insufficient documentation

## 2024-04-28 DIAGNOSIS — O358XX0 Maternal care for other (suspected) fetal abnormality and damage, not applicable or unspecified: Secondary | ICD-10-CM | POA: Insufficient documentation

## 2024-04-28 DIAGNOSIS — Z3689 Encounter for other specified antenatal screening: Secondary | ICD-10-CM | POA: Insufficient documentation

## 2024-04-28 DIAGNOSIS — O99342 Other mental disorders complicating pregnancy, second trimester: Secondary | ICD-10-CM | POA: Insufficient documentation

## 2024-04-28 DIAGNOSIS — Z362 Encounter for other antenatal screening follow-up: Secondary | ICD-10-CM | POA: Insufficient documentation

## 2024-04-28 DIAGNOSIS — O2692 Pregnancy related conditions, unspecified, second trimester: Secondary | ICD-10-CM | POA: Diagnosis not present

## 2024-04-28 DIAGNOSIS — R825 Elevated urine levels of drugs, medicaments and biological substances: Secondary | ICD-10-CM | POA: Diagnosis not present

## 2024-04-28 DIAGNOSIS — O09892 Supervision of other high risk pregnancies, second trimester: Secondary | ICD-10-CM | POA: Diagnosis present

## 2024-04-28 DIAGNOSIS — O26892 Other specified pregnancy related conditions, second trimester: Secondary | ICD-10-CM | POA: Diagnosis not present

## 2024-04-28 MED ORDER — URSODIOL 300 MG PO CAPS: 300.0000 mg | ORAL_CAPSULE | Freq: Two times a day (BID) | ORAL | 3 refills | Status: DC

## 2024-04-28 NOTE — Progress Notes (Signed)
 Patient information  Patient Name: Allison Trevino  Patient MRN:   [id number]  Referring practice: MFM Referring Provider: [place] [facility]  Problem List   Patient Active Problem List   Diagnosis Date Noted   Pruritus of pregnancy, second trimester 04/28/2024   Anemia 04/24/2024   Choroid plexus cyst of fetus in singleton pregnancy 03/24/2024   Rubella non-immune status, antepartum 01/31/2024   Supervision of other normal pregnancy, antepartum 01/15/2024   Maternal Fetal medicine Consult  Kimara ISAZA is a 18 y.o. G2P0010 at [redacted]w[redacted]d here for ultrasound and consultation. Taeko JIMENEZ-REYES is doing well today but is complaining of continued itching. Today we focused on the following:   Pruritus of pregnancy: The patient reports that she has had itching for about 1 month.  Previously bile acids were 6.2 (normal).  I discussed the importance of having these repeated throughout the pregnancy to ensure that she does not have cholestasis of pregnancy.  She reports the itching is all over her body but is worse on her feet.  She denies any time of day that makes it better or worse per se.  She does not have a rash except for when she itches her skin.  I discussed the possibility of taking Actigall.  However, she should have bile acids drawn before that time so the diagnosis is not confused.  The patient had time to ask questions that were answered to her satisfaction.  She verbalized understanding and agrees to proceed with the plan below.  Sonographic findings Single intrauterine pregnancy at 24w 4d. Fetal cardiac activity:  Observed and appears normal. Presentation: Cephalic. The anatomic structures that were well seen appear normal without evidence of soft markers. The anatomic survey is complete.  Fetal biometry shows the estimated fetal weight at the 56 percentile. Amniotic fluid: Within normal limits.  MVP: 3.4 cm. Placenta: Posterior.  There are limitations of  prenatal ultrasound such as the inability to detect certain abnormalities due to poor visualization. Various factors such as fetal position, gestational age and maternal body habitus may increase the difficulty in visualizing the fetal anatomy.    Recommendations - Repeat bile acids and CMP. -Actigall can be prescribed after the CMP and bile acids are repeated. - Follow-up growth ultrasound in 6 weeks.  If cholestasis is diagnosed then she should have antenatal testing as well.  Review of Systems: A review of systems was performed and was negative except per HPI   Vitals and Physical Exam    04/28/2024    1:22 PM 04/23/2024    1:42 PM 03/26/2024    2:55 PM  Vitals with BMI  Weight  124 lbs 110 lbs  BMI   22.21  Systolic 116 109 896  Diastolic 64 70 61  Pulse 75 79 61    Sitting comfortably on the sonogram table Nonlabored breathing Normal rate and rhythm Abdomen is nontender  Past pregnancies OB History  Gravida Para Term Preterm AB Living  2 0 0 0 1 0  SAB IAB Ectopic Multiple Live Births  1 0 0 0 0    # Outcome Date GA Lbr Len/2nd Weight Sex Type Anes PTL Lv  2 Current           1 SAB 12/06/22 [redacted]w[redacted]d            I spent 30 minutes reviewing the patients chart, including labs and images as well as counseling the patient about her medical conditions. Greater than 50% of the time was spent in  direct face-to-face patient counseling.  Delora Smaller  MFM, [facility]   04/28/2024  2:23 PM

## 2024-04-28 NOTE — Addendum Note (Signed)
 Addended by: WILLIAM DELORA SAILOR on: 04/28/2024 04:11 PM   Modules accepted: Orders

## 2024-04-29 ENCOUNTER — Ambulatory Visit: Payer: MEDICAID | Attending: Obstetrics and Gynecology

## 2024-04-29 DIAGNOSIS — L299 Pruritus, unspecified: Secondary | ICD-10-CM

## 2024-04-30 ENCOUNTER — Encounter (HOSPITAL_COMMUNITY): Payer: Self-pay | Admitting: Obstetrics & Gynecology

## 2024-04-30 ENCOUNTER — Other Ambulatory Visit: Payer: Self-pay

## 2024-04-30 ENCOUNTER — Inpatient Hospital Stay (HOSPITAL_COMMUNITY)
Admission: AD | Admit: 2024-04-30 | Discharge: 2024-05-01 | Disposition: A | Discharge status: Home / Self Care | Payer: MEDICAID | Attending: Obstetrics & Gynecology | Admitting: Obstetrics & Gynecology

## 2024-04-30 DIAGNOSIS — M549 Dorsalgia, unspecified: Secondary | ICD-10-CM | POA: Insufficient documentation

## 2024-04-30 DIAGNOSIS — R1032 Left lower quadrant pain: Secondary | ICD-10-CM | POA: Insufficient documentation

## 2024-04-30 DIAGNOSIS — O26892 Other specified pregnancy related conditions, second trimester: Secondary | ICD-10-CM | POA: Insufficient documentation

## 2024-04-30 DIAGNOSIS — Z3A25 25 weeks gestation of pregnancy: Secondary | ICD-10-CM | POA: Diagnosis not present

## 2024-04-30 DIAGNOSIS — O99891 Other specified diseases and conditions complicating pregnancy: Secondary | ICD-10-CM | POA: Diagnosis not present

## 2024-04-30 DIAGNOSIS — Z3492 Encounter for supervision of normal pregnancy, unspecified, second trimester: Secondary | ICD-10-CM

## 2024-04-30 DIAGNOSIS — Z3A24 24 weeks gestation of pregnancy: Secondary | ICD-10-CM

## 2024-04-30 LAB — URINALYSIS, ROUTINE W REFLEX MICROSCOPIC
Bilirubin Urine: NEGATIVE
Glucose, UA: NEGATIVE mg/dL
Hgb urine dipstick: NEGATIVE
Ketones, ur: NEGATIVE mg/dL
Leukocytes,Ua: NEGATIVE
Nitrite: NEGATIVE
Protein, ur: NEGATIVE mg/dL
Specific Gravity, Urine: 1.02 (ref 1.005–1.030)
pH: 6 (ref 5.0–8.0)

## 2024-04-30 LAB — WET PREP, GENITAL
Clue Cells Wet Prep HPF POC: NONE SEEN
Sperm: NONE SEEN
Trich, Wet Prep: NONE SEEN
WBC, Wet Prep HPF POC: <10 (ref <10)
Yeast Wet Prep HPF POC: NONE SEEN

## 2024-04-30 MED ORDER — CYCLOBENZAPRINE HCL 5 MG PO TABS
10.0000 mg | ORAL_TABLET | Freq: Once | ORAL | Status: AC
  Administered 2024-04-30: 10 mg via ORAL
  Filled 2024-04-30: qty 2

## 2024-04-30 MED ORDER — ACETAMINOPHEN 500 MG PO TABS
1000.0000 mg | ORAL_TABLET | Freq: Once | ORAL | Status: AC
  Administered 2024-04-30: 1000 mg via ORAL
  Filled 2024-04-30: qty 2

## 2024-04-30 NOTE — MAU Note (Signed)
 MAU Triage Note: Allison Trevino is a 18 y.o. at [redacted]w[redacted]d here in MAU reporting: hasn't felt any fetal movement since her last OB appointment on Monday. She is also having sharp pain in her left, lower back and abdomen that is constant. Denies VB or LOF.   Patient complaint: DFM  Pain Score: 7  Pain Location: Abdomen Pain Score: 7 Pain Location: Back   Onset of complaint: Monday LMP: Patient's last menstrual period was 11/08/2023 (exact date).  Vitals:   04/30/24 2032  BP: 109/66  Pulse: 91  Resp: 16  Temp: 98.3 F (36.8 C)  SpO2: 98%    FHT:  Fetal Heart Rate Mode: Doppler Baseline Rate (A): 157 bpm Multiple birth?: No Lab orders placed from triage: UA

## 2024-04-30 NOTE — MAU Provider Note (Signed)
 History     CSN: [id number]  Arrival date and time: 04/30/24 2018   Event Date/Time   First Provider Initiated Contact with Patient 04/30/24 2230      Chief Complaint  Patient presents with   Decreased Fetal Movement   Abdominal Pain   Back Pain   HPI [name] is a 18 y.o. year old G70P0010 female at [redacted]w[redacted]d weeks gestation who presents to MAU reporting no fetal movement felt since her MFM appt on Monday 04/28/2024. She also reports a sharp pain in the lower LT part of her back and a constant lower abdominal pain; rated 7/10 She receives [facility] with [facility]; next appt is 05/21/2024. Her friend is present and contributing to the history taking.   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   Anxiety state 04/08/2019   Depression    History of suicidal ideation    IBS (irritable bowel syndrome)    Insomnia 04/08/2019   Migraines    Seasonal allergies    Severe anxiety    followed by dr macarthur piety    Past Surgical History:  Procedure Laterality Date   CHOLECYSTECTOMY N/A 10/14/2023   Procedure: LAPAROSCOPIC CHOLECYSTECTOMY;  Surgeon: Belinda Cough, MD;  Location: [facility];  Service: General;  Laterality: N/A;   COLONOSCOPY N/A 10/11/2023   Procedure: COLONOSCOPY;  Surgeon: Dianna Specking, MD;  Location: [facility];  Service: Gastroenterology;  Laterality: N/A;   DILATION AND EVACUATION N/A 12/06/2022   Procedure: DILATATION AND EVACUATION;  Surgeon: Erik Kieth BROCKS, MD;  Location: [facility];  Service: Gynecology;  Laterality: N/A;    Family History  Problem Relation Age of Onset   Diabetes Mother    Healthy Father    Hypertension Neg Hx    Cancer Neg Hx     Social History   Tobacco Use   Smoking status:  Never    Passive exposure: Current   Smokeless tobacco: Never  Vaping Use   Vaping status: Never Used  Substance Use Topics   Alcohol use: Never   Drug use: Not Currently    Types: Marijuana    Comment: pos drug screen. Has not used since couple of weeks    Allergies:  Allergies  Allergen Reactions   Azithromycin  Itching and Nausea And Vomiting    Severe N/V and severe itching   Lactose Intolerance (Gi) Other (See Comments)    GI Upset   Metronidazole  Itching   Hydromorphone  Rash    Possible allergy to opiates including oxycodone  / dilaudid  / Fentanyl . Received opiate medications (dilaudid , fentanyl ) as well as Augmentin  on 4/15 developed rash. On 4/16 received Oxycodone  and Ceftriaxone  around same time + developed recurrent rash / itching.    Medications Prior to Admission  Medication Sig Dispense Refill Last Dose/Taking   aspirin  EC 81 MG tablet Take 1 tablet (81 mg total) by mouth daily. Start taking when you are [redacted] weeks pregnant for rest of pregnancy for prevention of preeclampsia 300 tablet 2 Past Week   ferrous sulfate 325 (65 FE) MG  tablet Take 1 tablet (325 mg total) by mouth every other day. 90 tablet 1 Past Week   Prenatal Vit-Fe Fumarate-FA (PRENATAL VITAMIN PLUS LOW IRON PO) Take 1 tablet by mouth daily.   04/30/2024   acetaminophen  (TYLENOL ) 500 MG tablet Take 2 tablets (1,000 mg total) by mouth every 6 (six) hours as needed for mild pain (pain score 1-3).      clindamycin  (CLEOCIN ) 2 % vaginal cream Place 1 Applicatorful vaginally at bedtime. (Patient not taking: Reported on 03/26/2024) 40 g 0    diphenhydrAMINE  (BENADRYL ) 50 MG tablet Take 0.5 tablets (25 mg total) by mouth every 8 (eight) hours as needed for itching. (Patient not taking: Reported on 03/26/2024) 10 tablet 0    propranolol  (INDERAL ) 10 MG tablet Take 10 mg by mouth daily as needed (anxiety). (Patient not taking: Reported on 04/28/2024)      simethicone  (MYLICON) 80 MG chewable tablet Chew 1 tablet (80 mg  total) by mouth every 6 (six) hours as needed. (Patient not taking: Reported on 03/26/2024) 30 tablet 0    ursodiol (ACTIGALL) 300 MG capsule Take 1 capsule (300 mg total) by mouth 2 (two) times daily. 60 capsule 3     Review of Systems  Constitutional: Negative.   HENT: Negative.    Eyes: Negative.   Respiratory: Negative.    Cardiovascular: Negative.   Gastrointestinal: Negative.   Endocrine: Negative.   Genitourinary:        No FM since appt on 11/3  Musculoskeletal: Negative.   Skin: Negative.   Allergic/Immunologic: Negative.   Neurological: Negative.   Hematological: Negative.   Psychiatric/Behavioral: Negative.     Physical Exam   Blood pressure 109/66, pulse 91, temperature 98.3 F (36.8 C), temperature source Oral, resp. rate 16, height 4' 11 (1.499 m), weight 53.8 kg, last menstrual period 11/08/2023, SpO2 98%.  Physical Exam Vitals and nursing note reviewed.  Constitutional:      Appearance: Normal appearance. She is normal weight.  Neurological:     Mental Status: She is alert.    REASSURING NST - FHR: 145 bpm / moderate variability / 10 x 10 accels present / decels absent / TOCO: none  MAU Course  Procedures  MDM CCUA fFN Wet Prep GC/CT -- Results pending   Results for orders placed or performed during the hospital encounter of 04/30/24 (from the past 24 hours)  Urinalysis, Routine w reflex microscopic -Urine, Clean Catch     Status: Abnormal   Collection Time: 04/30/24  8:32 PM  Result Value Ref Range   Color, Urine YELLOW YELLOW   APPearance HAZY (A) CLEAR   Specific Gravity, Urine 1.020 1.005 - 1.030   pH 6.0 5.0 - 8.0   Glucose, UA NEGATIVE NEGATIVE mg/dL   Hgb urine dipstick NEGATIVE NEGATIVE   Bilirubin Urine NEGATIVE NEGATIVE   Ketones, ur NEGATIVE NEGATIVE mg/dL   Protein, ur NEGATIVE NEGATIVE mg/dL   Nitrite NEGATIVE NEGATIVE   Leukocytes,Ua NEGATIVE NEGATIVE  Wet prep, genital     Status: None   Collection Time: 04/30/24 11:17 PM    Specimen: Vaginal  Result Value Ref Range   Yeast Wet Prep HPF POC NONE SEEN NONE SEEN   Trich, Wet Prep NONE SEEN NONE SEEN   Clue Cells Wet Prep HPF POC NONE SEEN NONE SEEN   WBC, Wet Prep HPF POC <10 <10   Sperm NONE SEEN      Assessment and Plan  1. Movement of fetus present during pregnancy in second trimester (  Primary) - Information provided on FKC    2. Back pain affecting pregnancy in second trimester - Information provided on back pain in pregnancy   3. [redacted] weeks gestation of pregnancy   - Discharge home - Keep scheduled appt with [facility] on 05/21/2024 - Patient verbalized an understanding of the plan of care and agrees.   Lanitra Battaglini, CNM 05/01/2024, 10:30 PM

## 2024-05-01 ENCOUNTER — Telehealth: Payer: Self-pay

## 2024-05-01 DIAGNOSIS — M549 Dorsalgia, unspecified: Secondary | ICD-10-CM

## 2024-05-01 DIAGNOSIS — O99891 Other specified diseases and conditions complicating pregnancy: Secondary | ICD-10-CM

## 2024-05-01 DIAGNOSIS — Z3A24 24 weeks gestation of pregnancy: Secondary | ICD-10-CM

## 2024-05-01 LAB — FETAL FIBRONECTIN: Fetal Fibronectin: NEGATIVE

## 2024-05-01 LAB — COMPREHENSIVE METABOLIC PANEL WITH GFR
ALT: 38 IU/L — ABNORMAL HIGH (ref 0–32)
ALT: 37 U/L (ref 0–44)
AST: 24 IU/L (ref 0–40)
AST: 28 U/L (ref 15–41)
Albumin: 3.5 g/dL — ABNORMAL LOW (ref 4.0–5.0)
Albumin: 2.8 g/dL — ABNORMAL LOW (ref 3.5–5.0)
Alkaline Phosphatase: 85 IU/L (ref 42–106)
Alkaline Phosphatase: 75 U/L (ref 38–126)
Anion gap: 10 (ref 5–15)
BUN/Creatinine Ratio: 18 (ref 9–23)
BUN: 8 mg/dL (ref 6–20)
BUN: 8 mg/dL (ref 6–20)
Bilirubin Total: 0.2 mg/dL (ref 0.0–1.2)
CO2: 22 mmol/L (ref 20–29)
CO2: 23 mmol/L (ref 22–32)
Calcium: 9.1 mg/dL (ref 8.7–10.2)
Calcium: 8.9 mg/dL (ref 8.9–10.3)
Chloride: 104 mmol/L (ref 96–106)
Chloride: 101 mmol/L (ref 98–111)
Creatinine, Ser: 0.44 mg/dL — ABNORMAL LOW (ref 0.57–1.00)
Creatinine, Ser: 0.59 mg/dL (ref 0.44–1.00)
GFR, Estimated: >60 mL/min (ref >60)
Globulin, Total: 2.6 g/dL (ref 1.5–4.5)
Glucose, Bld: 90 mg/dL (ref 70–99)
Glucose: 82 mg/dL (ref 70–99)
Potassium: 4.7 mmol/L (ref 3.5–5.2)
Potassium: 4.3 mmol/L (ref 3.5–5.1)
Sodium: 139 mmol/L (ref 134–144)
Sodium: 134 mmol/L — ABNORMAL LOW (ref 135–145)
Total Bilirubin: 0.5 mg/dL (ref 0.0–1.2)
Total Protein: 6.1 g/dL (ref 6.0–8.5)
Total Protein: 6.4 g/dL — ABNORMAL LOW (ref 6.5–8.1)
eGFR: 143 mL/min/1.73 (ref >59)

## 2024-05-01 LAB — GC/CHLAMYDIA PROBE AMP (~~LOC~~) NOT AT ARMC
Chlamydia: NEGATIVE
Comment: NEGATIVE
Comment: NORMAL
Neisseria Gonorrhea: NEGATIVE

## 2024-05-01 LAB — BILE ACIDS, TOTAL: Bile Acids Total: 5.7 umol/L (ref 0.0–10.0)

## 2024-05-01 NOTE — Telephone Encounter (Signed)
 Returned TC to pt. Consulted with [employer]. Pt is to pick up medications and take them due to present symptoms of itching.SABRA Rx prescribed by MFM. Pt bile acids WNL but pt is still experiencing symptoms and concerned about elevation of liver enzymes. Informed to take rx and see if this helps with her present symptoms.

## 2024-05-21 ENCOUNTER — Encounter: Payer: Self-pay | Admitting: Obstetrics

## 2024-05-21 ENCOUNTER — Encounter: Payer: Self-pay | Admitting: Obstetrics and Gynecology

## 2024-05-21 ENCOUNTER — Other Ambulatory Visit: Payer: MEDICAID

## 2024-05-21 ENCOUNTER — Ambulatory Visit: Payer: MEDICAID | Admitting: Obstetrics and Gynecology

## 2024-05-21 VITALS — BP 110/68 | HR 68 | Wt 118.0 lb

## 2024-05-21 DIAGNOSIS — Z3480 Encounter for supervision of other normal pregnancy, unspecified trimester: Secondary | ICD-10-CM

## 2024-05-21 DIAGNOSIS — D649 Anemia, unspecified: Secondary | ICD-10-CM

## 2024-05-21 DIAGNOSIS — Z3A27 27 weeks gestation of pregnancy: Secondary | ICD-10-CM

## 2024-05-21 DIAGNOSIS — Z2839 Other underimmunization status: Secondary | ICD-10-CM

## 2024-05-21 MED ORDER — ONDANSETRON 4 MG PO TBDP: 4.0000 mg | ORAL_TABLET | Freq: Four times a day (QID) | ORAL | 0 refills | Status: AC | PRN

## 2024-05-21 MED ORDER — FAMOTIDINE 20 MG PO TABS: 20.0000 mg | ORAL_TABLET | Freq: Two times a day (BID) | ORAL | 2 refills | Status: AC

## 2024-05-21 NOTE — Progress Notes (Addendum)
ROB/GTT.  TDAP given in RD, tolerated well. ?

## 2024-05-21 NOTE — Progress Notes (Addendum)
 PRENATAL VISIT NOTE  Subjective:  Allison Trevino is a 18 y.o. G2P0010 at [redacted]w[redacted]d being seen today for ongoing prenatal care.  She is currently monitored for the following issues for this low-risk pregnancy and has Supervision of other normal pregnancy, antepartum; Rubella non-immune status, antepartum; Choroid plexus cyst of fetus in singleton pregnancy; Anemia; and Pruritus of pregnancy, second trimester on their problem list.  Patient reports no complaints, she reports significant improvement in her pruritis.  Contractions: Not present. Vag. Bleeding: None.  Movement: Present. Denies leaking of fluid.   The following portions of the patient's history were reviewed and updated as appropriate: allergies, current medications, past family history, past medical history, past social history, past surgical history and problem list.   Objective:   Vitals:   05/21/24 0848  BP: 110/68  Pulse: 68  Weight: 118 lb (53.5 kg)    Fetal Status:  Fetal Heart Rate (bpm): 150 Fundal Height: 28 cm Movement: Present    General: Alert, oriented and cooperative. Patient is in no acute distress.  Skin: Skin is warm and dry. No rash noted.   Cardiovascular: Normal heart rate noted  Respiratory: Normal respiratory effort, no problems with respiration noted  Abdomen: Soft, gravid, appropriate for gestational age.  Pain/Pressure: Absent     Pelvic: Cervical exam deferred        Extremities: Normal range of motion.  Edema: None  Mental Status: Normal mood and affect. Normal behavior. Normal judgment and thought content.      05/21/2024    8:46 AM 01/30/2024    9:31 AM 01/15/2024    2:50 PM  Depression screen PHQ 2/9  Decreased Interest 0 0 0  Down, Depressed, Hopeless 0 0 0  PHQ - 2 Score 0 0 0  Altered sleeping 1 1 0  Tired, decreased energy 0 1 0  Change in appetite 0 0 0  Feeling bad or failure about yourself  0 0 0  Trouble concentrating 0 0 0  Moving slowly or fidgety/restless 0 0 0   Suicidal thoughts 0 0 0  PHQ-9 Score 1 2  0   Difficult doing work/chores Not difficult at all       Data saved with a previous flowsheet row definition        05/21/2024    8:47 AM 01/30/2024    9:31 AM 01/15/2024    2:50 PM  GAD 7 : Generalized Anxiety Score  Nervous, Anxious, on Edge 0 0 0  Control/stop worrying 0 0 0  Worry too much - different things 0 0 0  Trouble relaxing 0 0 0  Restless 0 0 0  Easily annoyed or irritable 0 1 0  Afraid - awful might happen 0 0 0  Total GAD 7 Score 0 1 0  Anxiety Difficulty Not difficult at all      Assessment and Plan:  Pregnancy: G2P0010 at [redacted]w[redacted]d 1. Supervision of other normal pregnancy, antepartum (Primary) Patient is doing well without complaints Third trimester labs and glucola today Patient agreed to tdap  Patient plans to use condoms and plans to use [facility] - Glucose Tolerance, 2 Hours w/1 Hour - RPR - CBC - HIV antibody (with reflex) - Tdap vaccine greater than or equal to 7yo IM  2. [redacted] weeks gestation of pregnancy   3. Rubella non-immune status, antepartum Will offer pp  4. Anemia, unspecified type Continue iron - Iron, TIBC and Ferritin Panel  Preterm labor symptoms and general obstetric precautions including but not  limited to vaginal bleeding, contractions, leaking of fluid and fetal movement were reviewed in detail with the patient. Please refer to After Visit Summary for other counseling recommendations.   Return in about 2 weeks (around 06/04/2024) for in person, ROB, Low risk.  Future Appointments  Date Time Provider Department Center  05/21/2024  9:15 AM Salihah Peckham, [place], MD [facility] None  06/10/2024  2:15 PM [facility] PROVIDER 1 [facility]  06/10/2024  2:30 PM [facility] US3 [facility]    Winton Felt, MD

## 2024-05-26 ENCOUNTER — Other Ambulatory Visit: Payer: MEDICAID

## 2024-05-26 DIAGNOSIS — Z3480 Encounter for supervision of other normal pregnancy, unspecified trimester: Secondary | ICD-10-CM

## 2024-05-27 ENCOUNTER — Other Ambulatory Visit: Payer: Self-pay | Admitting: Obstetrics and Gynecology

## 2024-05-27 ENCOUNTER — Other Ambulatory Visit: Payer: Self-pay

## 2024-05-27 ENCOUNTER — Ambulatory Visit: Payer: Self-pay | Admitting: Obstetrics and Gynecology

## 2024-05-27 DIAGNOSIS — O24419 Gestational diabetes mellitus in pregnancy, unspecified control: Secondary | ICD-10-CM | POA: Insufficient documentation

## 2024-05-27 DIAGNOSIS — O24410 Gestational diabetes mellitus in pregnancy, diet controlled: Secondary | ICD-10-CM

## 2024-05-27 DIAGNOSIS — R002 Palpitations: Secondary | ICD-10-CM

## 2024-05-27 LAB — CBC
Hematocrit: 30.1 % — ABNORMAL LOW (ref 34.0–46.6)
Hemoglobin: 10 g/dL — ABNORMAL LOW (ref 11.1–15.9)
MCH: 30.9 pg (ref 26.6–33.0)
MCHC: 33.2 g/dL (ref 31.5–35.7)
MCV: 93 fL (ref 79–97)
Platelets: 270 x10E3/uL (ref 150–450)
RBC: 3.24 x10E6/uL — ABNORMAL LOW (ref 3.77–5.28)
RDW: 12.9 % (ref 11.7–15.4)
WBC: 9.7 x10E3/uL (ref 3.4–10.8)

## 2024-05-27 LAB — GLUCOSE TOLERANCE, 2 HOURS W/ 1HR
Glucose, 1 hour: 142 mg/dL (ref 70–179)
Glucose, 2 hour: 136 mg/dL (ref 70–152)
Glucose, Fasting: 110 mg/dL — ABNORMAL HIGH (ref 70–91)

## 2024-05-27 LAB — HIV ANTIBODY (ROUTINE TESTING W REFLEX): HIV Screen 4th Generation wRfx: NONREACTIVE

## 2024-05-27 LAB — SYPHILIS: RPR W/REFLEX TO RPR TITER AND TREPONEMAL ANTIBODIES, TRADITIONAL SCREENING AND DIAGNOSIS ALGORITHM: RPR Ser Ql: NONREACTIVE

## 2024-05-27 MED ORDER — ACCU-CHEK SOFTCLIX LANCETS MISC: 12 refills | Status: DC

## 2024-05-27 MED ORDER — ACCU-CHEK GUIDE W/DEVICE KIT: 1.0000 | PACK | Freq: Four times a day (QID) | 0 refills | Status: DC

## 2024-05-27 MED ORDER — ACCU-CHEK GUIDE TEST VI STRP: ORAL_STRIP | 12 refills | Status: DC

## 2024-05-28 NOTE — Progress Notes (Unsigned)
 Patient was seen for Gestational Diabetes on 05/29/2024  Start time 1614 and End time 1645   Estimated due date: 08/14/2024; [redacted]w[redacted]d   Clinical: Medications:  Current Outpatient Medications:    Accu-Chek Softclix Lancets lancets, 4 times daily, Disp: 100 each, Rfl: 12   acetaminophen  (TYLENOL ) 500 MG tablet, Take 2 tablets (1,000 mg total) by mouth every 6 (six) hours as needed for mild pain (pain score 1-3)., Disp: , Rfl:    aspirin  EC 81 MG tablet, Take 1 tablet (81 mg total) by mouth daily. Start taking when you are [redacted] weeks pregnant for rest of pregnancy for prevention of preeclampsia, Disp: 300 tablet, Rfl: 2   Blood Glucose Monitoring Suppl (ACCU-CHEK GUIDE) w/Device KIT, 1 Device by Does not apply route 4 (four) times daily. Use 4 times daily as instructed, Disp: 1 kit, Rfl: 0   diphenhydrAMINE  (BENADRYL ) 50 MG tablet, Take 0.5 tablets (25 mg total) by mouth every 8 (eight) hours as needed for itching. (Patient not taking: Reported on 03/26/2024), Disp: 10 tablet, Rfl: 0   famotidine  (PEPCID ) 20 MG tablet, Take 1 tablet (20 mg total) by mouth 2 (two) times daily., Disp: 60 tablet, Rfl: 2   ferrous sulfate  325 (65 FE) MG tablet, Take 1 tablet (325 mg total) by mouth every other day., Disp: 90 tablet, Rfl: 1   glucose blood (ACCU-CHEK GUIDE TEST) test strip, 4 times daily, Disp: 100 each, Rfl: 12   ondansetron  (ZOFRAN -ODT) 4 MG disintegrating tablet, Take 1 tablet (4 mg total) by mouth every 6 (six) hours as needed for nausea., Disp: 20 tablet, Rfl: 0   Prenatal Vit-Fe Fumarate-FA (PRENATAL VITAMIN PLUS LOW IRON PO), Take 1 tablet by mouth daily., Disp: , Rfl:    propranolol  (INDERAL ) 10 MG tablet, Take 10 mg by mouth daily as needed (anxiety). (Patient not taking: Reported on 04/28/2024), Disp: , Rfl:    simethicone  (MYLICON) 80 MG chewable tablet, Chew 1 tablet (80 mg total) by mouth every 6 (six) hours as needed. (Patient not taking: Reported on 03/26/2024), Disp: 30 tablet, Rfl: 0   ursodiol   (ACTIGALL ) 300 MG capsule, Take 1 capsule (300 mg total) by mouth 2 (two) times daily., Disp: 60 capsule, Rfl: 3  Medical History:  Past Medical History:  Diagnosis Date   Abnormal involuntary movement 04/08/2019   Anxiety state 04/08/2019   Depression    History of suicidal ideation    IBS (irritable bowel syndrome)    Insomnia 04/08/2019   Migraines    Seasonal allergies    Severe anxiety    followed by dr macarthur piety    Labs: OGTT fasting 110, 1 hour 142, 2 hour 136 on 05/26/2024 Lab Results  Component Value Date   HGBA1C 5.2 01/30/2024   Dietary and Lifestyle History: Pt presents today alone. Pt reports she is not working at this time. Pt report her family does the shopping and cooking. Pt reports she picked up meter kit, lancets and strips from pharmacy. Pt reports she is monitoring four times daily before breakfast and aiming for 2 hour post prandial. Pt reports tracking her blood sugar values using an app.   All Pt's questions were answered during this encounter.    Physical Activity: walking 3-6 days weekly for 10 minutes Stress: 8-10 out of 10 /self care includes: therapy  Sleep: ok   24 hr Recall:  First Meal:  fiber cereal from [facility], 2% milk (100 mg/dL, reported as 1 hour post prandial  per Pt) Snack:  none  Second meal: grilled chicken on bun, macaroni cheese, cold green tea (101 mg/dL, reported as 2.5 hour post prandial Per Pt ) Snack:  none Third meal: corn dog, carrots, broccoli, water  ( 100 mg/dL, reported as 2 hour post prandial) Snack:  none Beverages:  2% milk, water cold green tea  NUTRITION INTERVENTION  Nutrition education (E-1) on the following topics:   Initial Follow-up  [x]  []  Definition of Gestational Diabetes [x]  []  Why dietary management is important in controlling blood glucose Poorly controlled diabetes can lead to fetal macrosomia, shoulder dystocia and neurological injuries, stillbirth and neonatal complications including respiratory  distress syndrome, hypoglycemia and prolonged NICU admission.  [x]  []  Effects each nutrient has on blood glucose levels [x]  []  Simple carbohydrates vs complex carbohydrates [x]  []  Fluid intake [x]  []  Creating a balanced meal plan [x]  []  Carbohydrate counting  [x]  []  When to check blood glucose levels [x]  []  Proper blood glucose monitoring techniques [x]  []  Effect of stress and stress reduction techniques  [x]  []  Exercise effect on blood glucose levels, appropriate exercise during pregnancy [x]  []  Importance of limiting caffeine and abstaining from alcohol and smoking [x]  []  Medications used for blood sugar control during pregnancy [x]  []  Hypoglycemia and rule of 15 [x]  []  Postpartum self care  Accu chek- Patient has a meter prior to visit. Patient is instructed to begin testing pre breakfast and 2 hours after each meal. CBG: 101 mg/dL, reported as 2.5 hour post prandial per Pt reporting  Patient instructed to monitor glucose levels: QID FBS: 60 - <= 95 mg/dL; 2 hour: <= 879 mg/dL  Patient received handouts: Nutrition Diabetes and Pregnancy Carbohydrate Counting List Blood glucose log Snack ideas for diabetes during pregnancy Plate Planner  Patient will be seen for follow-up as needed.

## 2024-05-29 ENCOUNTER — Other Ambulatory Visit: Payer: Self-pay

## 2024-05-29 ENCOUNTER — Ambulatory Visit: Payer: MEDICAID

## 2024-05-29 ENCOUNTER — Encounter: Payer: MEDICAID | Attending: Obstetrics and Gynecology | Admitting: Dietician

## 2024-05-29 DIAGNOSIS — O24410 Gestational diabetes mellitus in pregnancy, diet controlled: Secondary | ICD-10-CM | POA: Diagnosis not present

## 2024-05-29 DIAGNOSIS — Z3A30 30 weeks gestation of pregnancy: Secondary | ICD-10-CM | POA: Insufficient documentation

## 2024-05-29 DIAGNOSIS — Z713 Dietary counseling and surveillance: Secondary | ICD-10-CM | POA: Insufficient documentation

## 2024-06-04 ENCOUNTER — Ambulatory Visit: Payer: MEDICAID | Admitting: Obstetrics and Gynecology

## 2024-06-04 ENCOUNTER — Encounter: Payer: Self-pay | Admitting: Obstetrics and Gynecology

## 2024-06-04 VITALS — BP 117/72 | HR 84 | Wt 122.8 lb

## 2024-06-04 DIAGNOSIS — R002 Palpitations: Secondary | ICD-10-CM

## 2024-06-04 DIAGNOSIS — O24410 Gestational diabetes mellitus in pregnancy, diet controlled: Secondary | ICD-10-CM

## 2024-06-04 DIAGNOSIS — Z3480 Encounter for supervision of other normal pregnancy, unspecified trimester: Secondary | ICD-10-CM

## 2024-06-04 DIAGNOSIS — Z3A29 29 weeks gestation of pregnancy: Secondary | ICD-10-CM

## 2024-06-04 DIAGNOSIS — D649 Anemia, unspecified: Secondary | ICD-10-CM

## 2024-06-04 DIAGNOSIS — L299 Pruritus, unspecified: Secondary | ICD-10-CM

## 2024-06-04 NOTE — Progress Notes (Signed)
° °  PRENATAL VISIT NOTE  Subjective:  Allison Trevino is a 18 y.o. G2P0010 at [redacted]w[redacted]d being seen today for ongoing prenatal care.  She is currently monitored for the following issues for this high-risk pregnancy and has Supervision of other normal pregnancy, antepartum; Rubella non-immune status, antepartum; Choroid plexus cyst of fetus in singleton pregnancy; Anemia; Pruritus of pregnancy, second trimester; and Gestational diabetes mellitus (GDM) affecting pregnancy on their problem list.  Patient reports itching, palpitations.  Contractions: Irritability. Vag. Bleeding: None.  Movement: Present. Denies leaking of fluid.   The following portions of the patient's history were reviewed and updated as appropriate: allergies, current medications, past family history, past medical history, past social history, past surgical history and problem list.   Objective:    Vitals:   06/04/24 1434  BP: 117/72  Pulse: 84  Weight: 55.7 kg    Fetal Status:  Fetal Heart Rate (bpm): 141   Movement: Present    General: Alert, oriented and cooperative. Patient is in no acute distress.  Skin: Skin is warm and dry. No rash noted.   Cardiovascular: Normal heart rate noted  Respiratory: Normal respiratory effort, no problems with respiration noted  Abdomen: Soft, gravid, appropriate for gestational age.  Pain/Pressure: Present     Pelvic: Cervical exam deferred        Extremities: Normal range of motion.  Edema: None  Mental Status: Normal mood and affect. Normal behavior. Normal judgment and thought content.   Assessment and Plan:  Pregnancy: G2P0010 at [redacted]w[redacted]d There are no diagnoses linked to this encounter. Preterm labor symptoms and general obstetric precautions including but not limited to vaginal bleeding, contractions, leaking of fluid and fetal movement were reviewed in detail with the patient. Please refer to After Visit Summary for other counseling recommendations.   1. Supervision of other  normal pregnancy, antepartum -Feeling well today. Endorses fetal movement. BP, HR, FHR within appropriate range.   2. [redacted] weeks gestation of pregnancy   3. Diet controlled gestational diabetes mellitus (GDM) in third trimester -Blood glucose readings mostly within range today  4. Anemia -Continue PO iron every other day  5. Pruritus -Notes itching in palms and soles of feet  -Actigall  causes severe nausea - Bile acids, total  6. Intermittent palpitations -OB cards referral placed, patient received call today to schedule appointment    No follow-ups on file.  Future Appointments  Date Time Provider Department Center  06/10/2024  2:15 PM [facility] PROVIDER 1 [facility]  06/10/2024  2:30 PM [facility] US3 [facility]  06/16/2024  2:30 PM Delores Nidia CROME, FNP [facility] None  06/30/2024  2:50 PM Delores Nidia CROME, FNP [facility] None    Rolin Amel, S-WHNP

## 2024-06-04 NOTE — Progress Notes (Signed)
 GDM; pt has blood sugar log with her today.   Pt states her HR goes over 120 while sitting for the past 3-4 weeks. Asking about referral to cardiology.   Also asking about bile acids from last appt.

## 2024-06-05 LAB — COMPREHENSIVE METABOLIC PANEL WITH GFR
ALT: 12 IU/L (ref 0–32)
AST: 15 IU/L (ref 0–40)
Albumin: 3.5 g/dL — ABNORMAL LOW (ref 4.0–5.0)
Alkaline Phosphatase: 107 IU/L — ABNORMAL HIGH (ref 42–106)
BUN/Creatinine Ratio: 26 — ABNORMAL HIGH (ref 9–23)
BUN: 10 mg/dL (ref 6–20)
Bilirubin Total: <0.2 mg/dL (ref 0.0–1.2)
CO2: 17 mmol/L — ABNORMAL LOW (ref 20–29)
Calcium: 8.6 mg/dL — ABNORMAL LOW (ref 8.7–10.2)
Chloride: 104 mmol/L (ref 96–106)
Creatinine, Ser: 0.39 mg/dL — ABNORMAL LOW (ref 0.57–1.00)
Globulin, Total: 2.5 g/dL (ref 1.5–4.5)
Glucose: 103 mg/dL — ABNORMAL HIGH (ref 70–99)
Potassium: 3.8 mmol/L (ref 3.5–5.2)
Sodium: 136 mmol/L (ref 134–144)
Total Protein: 6 g/dL (ref 6.0–8.5)
eGFR: 147 mL/min/1.73 (ref >59)

## 2024-06-06 LAB — BILE ACIDS, TOTAL: Bile Acids Total: 3.7 umol/L (ref 0.0–10.0)

## 2024-06-09 LAB — CBC
Hematocrit: 31.7 % — ABNORMAL LOW (ref 34.0–46.6)
Hemoglobin: 10 g/dL — ABNORMAL LOW (ref 11.1–15.9)
MCH: 31 pg (ref 26.6–33.0)
MCHC: 31.5 g/dL (ref 31.5–35.7)
MCV: 98 fL — ABNORMAL HIGH (ref 79–97)
Platelets: 225 x10E3/uL (ref 150–450)
RBC: 3.23 x10E6/uL — ABNORMAL LOW (ref 3.77–5.28)
RDW: 13.2 % (ref 11.7–15.4)
WBC: 9.7 x10E3/uL (ref 3.4–10.8)

## 2024-06-09 LAB — HIV ANTIBODY (ROUTINE TESTING W REFLEX): HIV Screen 4th Generation wRfx: NONREACTIVE

## 2024-06-09 LAB — GLUCOSE TOLERANCE, 2 HOURS W/ 1HR

## 2024-06-09 LAB — SYPHILIS: RPR W/REFLEX TO RPR TITER AND TREPONEMAL ANTIBODIES, TRADITIONAL SCREENING AND DIAGNOSIS ALGORITHM: RPR Ser Ql: NONREACTIVE

## 2024-06-10 ENCOUNTER — Ambulatory Visit: Payer: MEDICAID

## 2024-06-10 ENCOUNTER — Other Ambulatory Visit: Payer: Self-pay | Admitting: *Deleted

## 2024-06-10 VITALS — BP 109/67

## 2024-06-10 DIAGNOSIS — O26643 Intrahepatic cholestasis of pregnancy, third trimester: Secondary | ICD-10-CM | POA: Insufficient documentation

## 2024-06-10 DIAGNOSIS — Z362 Encounter for other antenatal screening follow-up: Secondary | ICD-10-CM | POA: Insufficient documentation

## 2024-06-10 DIAGNOSIS — K831 Obstruction of bile duct: Secondary | ICD-10-CM | POA: Diagnosis not present

## 2024-06-10 DIAGNOSIS — O26613 Liver and biliary tract disorders in pregnancy, third trimester: Secondary | ICD-10-CM | POA: Insufficient documentation

## 2024-06-10 DIAGNOSIS — O99342 Other mental disorders complicating pregnancy, second trimester: Secondary | ICD-10-CM | POA: Insufficient documentation

## 2024-06-10 DIAGNOSIS — O09893 Supervision of other high risk pregnancies, third trimester: Secondary | ICD-10-CM | POA: Insufficient documentation

## 2024-06-10 DIAGNOSIS — O26893 Other specified pregnancy related conditions, third trimester: Secondary | ICD-10-CM | POA: Diagnosis not present

## 2024-06-10 DIAGNOSIS — O24410 Gestational diabetes mellitus in pregnancy, diet controlled: Secondary | ICD-10-CM | POA: Diagnosis present

## 2024-06-10 DIAGNOSIS — Z3480 Encounter for supervision of other normal pregnancy, unspecified trimester: Secondary | ICD-10-CM | POA: Diagnosis present

## 2024-06-10 DIAGNOSIS — Z3A30 30 weeks gestation of pregnancy: Secondary | ICD-10-CM | POA: Diagnosis not present

## 2024-06-10 DIAGNOSIS — O2690 Pregnancy related conditions, unspecified, unspecified trimester: Secondary | ICD-10-CM | POA: Insufficient documentation

## 2024-06-10 DIAGNOSIS — O24419 Gestational diabetes mellitus in pregnancy, unspecified control: Secondary | ICD-10-CM | POA: Insufficient documentation

## 2024-06-10 DIAGNOSIS — O26649 Intrahepatic cholestasis of pregnancy, unspecified trimester: Secondary | ICD-10-CM | POA: Insufficient documentation

## 2024-06-10 DIAGNOSIS — O358XX0 Maternal care for other (suspected) fetal abnormality and damage, not applicable or unspecified: Secondary | ICD-10-CM | POA: Diagnosis not present

## 2024-06-10 NOTE — Progress Notes (Signed)
 MFM Consult Note  Allison Trevino is currently at [redacted]w[redacted]d. She has been followed due to a teenage pregnancy and recently diagnosed diet-controlled gestational diabetes.    She reports that her fingerstick values have mostly been within normal limits.  Her blood pressure today was 109/61.  The patient continues to experience whole body itching especially of her hands and feet.  She had repeat total bile acids drawn 6 days ago (when she was taking Actigall ) which was 3.7.  She reports that she has stopped taking Actigall  due to persistent nausea.  She also reports that she has been experiencing migraine headaches and a rapid heartbeat in the 120s range.  She is scheduled to see the cardio obstetrics team at the end of this week.  Sonographic findings Single intrauterine pregnancy at 30w 5d.  Fetal cardiac activity:  Observed and appears normal. Presentation: Cephalic. Fetal biometry shows the estimated fetal weight of 3 lb 13 oz,  1721g (55%). Amniotic fluid volume: Within normal limits. AFI: 14.72cm.  MVP: 5.18 cm. Placenta: Posterior Fundal.  Gestational Diabetes   She was advised to continue to monitor her fingersticks 4 times daily (fasting and 2 hours after each meal).    She was advised that our goals for her fingerstick values are fasting values of 90-95 or less and two-hour postprandial values of 120 or less.    Should the majority (greater than 50%) of her fingerstick results be above these values, she may have to be started on insulin or metformin to help her achieve better glycemic control.   Possible cholestasis of pregnancy  As the patient continues to complain of whole body itching especially of the hands and feet, she was advised that she probably has cholestasis of pregnancy even though her total bile acids drawn 6 days ago was within normal limits.    Her total bile acids could have been lowered as she was taking Actigall .  The increased risk of a fetal demise  associated with cholestasis of pregnancy was discussed.  She was advised to continue to monitor fetal movements on a daily basis.  Due to gestational diabetes and probable cholestasis of pregnancy, we will continue to follow her with weekly fetal testing until delivery.    Delivery should be considered at around 37 weeks.  Rapid maternal heartbeat/palpitations  She was advised to discuss the management of her rapid heartbeat with the cardio obstetrics team.    Her most recent hemoglobin and hematocrit were 10/30.   She was advised that propranolol  10 mg to 20 mg daily has been used for migraine prophylaxis in pregnancy and for the treatment of maternal palpitations.  She was advised that she may take propranolol  if necessary.  She will return in 1 week for an NST.  The patient stated that all of her questions were answered.   A total of 20 minutes was spent counseling and coordinating the care for this patient.  Greater than 50% of the time was spent in direct face-to-face contact.

## 2024-06-13 ENCOUNTER — Ambulatory Visit: Payer: MEDICAID

## 2024-06-13 ENCOUNTER — Ambulatory Visit: Payer: MEDICAID | Attending: Cardiology | Admitting: Cardiology

## 2024-06-13 ENCOUNTER — Encounter: Payer: Self-pay | Admitting: Cardiology

## 2024-06-13 VITALS — BP 100/62 | HR 104 | Ht 59.0 in | Wt 124.8 lb

## 2024-06-13 DIAGNOSIS — R002 Palpitations: Secondary | ICD-10-CM | POA: Insufficient documentation

## 2024-06-13 DIAGNOSIS — Z3A31 31 weeks gestation of pregnancy: Secondary | ICD-10-CM | POA: Insufficient documentation

## 2024-06-13 NOTE — Patient Instructions (Signed)
 Medication Instructions:  Your physician recommends that you continue on your current medications as directed. Please refer to the Current Medication list given to you today.  *If you need a refill on your cardiac medications before your next appointment, please call your pharmacy*   Testing/Procedures: ZIO XT- Long Term Monitor Instructions  Your physician has requested you wear a ZIO patch monitor for 7 days.  This is a single patch monitor. [employer] supplies one patch monitor per enrollment. Additional stickers are not available. Please do not apply patch if you will be having a Nuclear Stress Test,  Echocardiogram, Cardiac CT, MRI, or Chest Xray during the period you would be wearing the  monitor. The patch cannot be worn during these tests. You cannot remove and re-apply the  ZIO XT patch monitor.  Your ZIO patch monitor will be mailed 3 day USPS to your address on file. It may take 3-5 days  to receive your monitor after you have been enrolled.  Once you have received your monitor, please review the enclosed instructions. Your monitor  has already been registered assigning a specific monitor serial # to you.  Billing and Patient Assistance Program Information  We have supplied [employer] with any of your insurance information on file for billing purposes. [employer] offers a sliding scale Patient Assistance Program for patients that do not have  insurance, or whose insurance does not completely cover the cost of the ZIO monitor.  You must apply for the Patient Assistance Program to qualify for this discounted rate.  To apply, please call [employer] at [phone], select option 4, select option 2, ask to apply for  Patient Assistance Program. Meredeth will ask your household income, and how many people  are in your household. They will quote your out-of-pocket cost based on that information.  [employer] will also be able to set up a 58-month, interest-free payment plan if needed.  Applying the  monitor   Shave hair from upper left chest.  Hold abrader disc by orange tab. Rub abrader in 40 strokes over the upper left chest as  indicated in your monitor instructions.  Clean area with 4 enclosed alcohol pads. Let dry.  Apply patch as indicated in monitor instructions. Patch will be placed under collarbone on left  side of chest with arrow pointing upward.  Rub patch adhesive wings for 2 minutes. Remove white label marked 1. Remove the white  label marked 2. Rub patch adhesive wings for 2 additional minutes.  While looking in a mirror, press and release button in center of patch. A small green light will  flash 3-4 times. This will be your only indicator that the monitor has been turned on.  Do not shower for the first 24 hours. You may shower after the first 24 hours.  Press the button if you feel a symptom. You will hear a small click. Record Date, Time and  Symptom in the Patient Logbook.  When you are ready to remove the patch, follow instructions on the last 2 pages of Patient  Logbook. Stick patch monitor onto the last page of Patient Logbook.  Place Patient Logbook in the blue and white box. Use locking tab on box and tape box closed  securely. The blue and white box has prepaid postage on it. Please place it in the mailbox as  soon as possible. Your physician should have your test results approximately 7 days after the  monitor has been mailed back to [facility].  Call [facility]  at [phone] if you have questions regarding  your ZIO XT patch monitor. Call them immediately if you see an orange light blinking on your  monitor.  If your monitor falls off in less than 4 days, contact our Monitor department at [phone].  If your monitor becomes loose or falls off after 4 days call [employer] at [phone] for  suggestions on securing your monitor   Follow-Up: At [facility], you and your health needs are our priority.  As part of  our continuing mission to provide you with exceptional heart care, our providers are all part of one team.  This team includes your primary Cardiologist (physician) and Advanced Practice Providers or APPs (Physician Assistants and Nurse Practitioners) who all work together to provide you with the care you need, when you need it.  Your next appointment:    As needed   Provider:   Kardie Tobb, DO

## 2024-06-13 NOTE — Progress Notes (Unsigned)
 "  Cardio-Obstetrics Clinic  New Evaluation  Date:  06/14/2024   ID:  Allison Trevino, DOB [birth year], MRN [id number]  PCP:  Pcp, No   [facility] Providers Cardiologist:  Dub Huntsman, DO  Electrophysiologist:  None       Referring MD: Alger Gong, MD   Chief Complaint:  I am having palpitations  History of Present Illness:    Allison Trevino is a 18 y.o. female [G2P0010] who is being seen today for the evaluation of  at the request of Constant, Peggy, MD.   Medical history includes anxiety, depression, migraine headaches here today to be evaluated for palpitations.  She has been experiencing intermittent palpitations.  She described this as abrupt onset of her as happy.  No lightheadedness no dizziness.  Very uncomfortable with these sensation.  As symptoms do feel like her heart, fall on her chest.  This is new in starting pregnancy.  Although prior to pregnancy she was on propranolol  for her severe anxiety.  Prior CV Studies Reviewed: The following studies were reviewed today:   Past Medical History:  Diagnosis Date   Abnormal involuntary movement 04/08/2019   Anxiety state 04/08/2019   Depression    History of suicidal ideation    IBS (irritable bowel syndrome)    Insomnia 04/08/2019   Migraines    Seasonal allergies    Severe anxiety    followed by dr macarthur piety    Past Surgical History:  Procedure Laterality Date   CHOLECYSTECTOMY N/A 10/14/2023   Procedure: LAPAROSCOPIC CHOLECYSTECTOMY;  Surgeon: Belinda Cough, MD;  Location: [facility];  Service: General;  Laterality: N/A;   COLONOSCOPY N/A 10/11/2023   Procedure: COLONOSCOPY;  Surgeon: Dianna Specking, MD;  Location: [facility];  Service: Gastroenterology;  Laterality: N/A;   DILATION AND EVACUATION N/A 12/06/2022   Procedure: DILATATION AND EVACUATION;  Surgeon: Erik Kieth BROCKS, MD;  Location: [facility];  Service: Gynecology;  Laterality: N/A;       OB History     Gravida  2   Para  0   Term  0   Preterm  0   AB  1   Living  0      SAB  1   IAB  0   Ectopic  0   Multiple  0   Live Births  0               Current Medications: Active Medications[1]   Allergies:   Hydroxyzine , Azithromycin , Lactose intolerance (gi), Metronidazole , and Hydromorphone    Social History   Socioeconomic History   Marital status: Single    Spouse name: Not on file   Number of children: Not on file   Years of education: Not on file   Highest education level: 12th grade  Occupational History   Not on file  Tobacco Use   Smoking status: Never    Passive exposure: Current   Smokeless tobacco: Never  Vaping Use   Vaping status: Never Used  Substance and Sexual Activity   Alcohol use: Never   Drug use: Not Currently    Types: Marijuana    Comment: pos drug screen. Has not used since couple of weeks   Sexual activity: Yes  Other Topics Concern   Not on file  Social History Narrative   Henlee graduated from [employer] May 2024   She lives with both parents, four siblings,  she is the youngest   Social Drivers of Health   Tobacco Use:  Medium Risk (06/13/2024)   Patient History    Smoking Tobacco Use: Never    Smokeless Tobacco Use: Never    Passive Exposure: Current  Financial Resource Strain: Not on file  Food Insecurity: No Food Insecurity (10/10/2023)   Hunger Vital Sign    Worried About Running Out of Food in the Last Year: Never true    Ran Out of Food in the Last Year: Never true  Transportation Needs: No Transportation Needs (10/10/2023)   PRAPARE - Administrator, Civil Service (Medical): No    Lack of Transportation (Non-Medical): No  Physical Activity: Not on file  Stress: Not on file  Social Connections: Not on file  Depression (PHQ2-9): Low Risk (05/21/2024)   Depression (PHQ2-9)    PHQ-2 Score: 1  Alcohol Screen: Not on file  Housing: Low Risk (10/10/2023)   Housing Stability  Vital Sign    Unable to Pay for Housing in the Last Year: No    Number of Times Moved in the Last Year: 0    Homeless in the Last Year: No  Utilities: Not At Risk (10/10/2023)   AHC Utilities    Threatened with loss of utilities: No  Health Literacy: Not on file      Family History  Problem Relation Age of Onset   Diabetes Mother    Healthy Father    Hypertension Neg Hx    Cancer Neg Hx       ROS:   Please see the history of present illness.    Palpitations  All other systems reviewed and are negative.   Labs/EKG Reviewed:    EKG:   EKG was not ordered today.    Recent Labs: 10/14/2023: Magnesium 2.1 05/26/2024: Hemoglobin 10.0; Platelets 270 06/04/2024: ALT 12; BUN 10; Creatinine, Ser 0.39; Potassium 3.8; Sodium 136   Recent Lipid Panel Lab Results  Component Value Date/Time   CHOL 162 07/04/2021 06:45 PM   TRIG 45 07/04/2021 06:45 PM   HDL 48 07/04/2021 06:45 PM   CHOLHDL 3.4 07/04/2021 06:45 PM   LDLCALC 105 (H) 07/04/2021 06:45 PM    Physical Exam:    VS:  BP 100/62 (BP Location: Right Arm, Patient Position: Sitting, Cuff Size: Normal)   Pulse (!) 104   Ht 4' 11 (1.499 m)   Wt 124 lb 12.8 oz (56.6 kg)   LMP 11/08/2023 (Exact Date)   SpO2 98%   BMI 25.21 kg/m     Wt Readings from Last 3 Encounters:  06/13/24 124 lb 12.8 oz (56.6 kg) (46%, Z= -0.09)*  06/04/24 122 lb 12.8 oz (55.7 kg) (42%, Z= -0.19)*  05/21/24 118 lb (53.5 kg) (32%, Z= -0.46)*   * Growth percentiles are based on CDC (Girls, 2-20 Years) data.     GEN:  Well nourished, well developed in no acute distress HEENT: Normal NECK: No JVD; No carotid bruits LYMPHATICS: No lymphadenopathy CARDIAC: RRR, 1 out of 6 holosystolic soft murmur, rubs, gallops RESPIRATORY:  Clear to auscultation without rales, wheezing or rhonchi  ABDOMEN: Soft, non-tender, non-distended MUSCULOSKELETAL:  No edema; No deformity  SKIN: Warm and dry NEUROLOGIC:  Alert and oriented x 3 PSYCHIATRIC:  Normal affect     Risk Assessment/Risk Calculators:     CARPREG II Risk Prediction Index Score:  1.  The patient's risk for a primary cardiac event is 5%.   Modified World Health Organization [facility]) Classification of Maternal CV Risk   Class I  ASSESSMENT & PLAN:    Palpitation  I would like to rule out a cardiovascular etiology of this palpitation, therefore at this time I would like to placed a zio patch for 7  days.  She has been off propranolol  for several weeks now this could potentially be beta-blocker withdrawal but she was on a very low-dose of propranolol .  This also could be in the setting of her untreated anxiety.  In the meantime we will get the information for her monitor and see what the next steps for treatment are.   Dispo:  No follow-ups on file.   Medication Adjustments/Labs and Tests Ordered: Current medicines are reviewed at length with the patient today.  Concerns regarding medicines are outlined above.  Tests Ordered: Orders Placed This Encounter  Procedures   LONG TERM MONITOR (3-14 DAYS)   Medication Changes: No orders of the defined types were placed in this encounter.     [1]  Current Meds  Medication Sig   Accu-Chek Softclix Lancets lancets 4 times daily   aspirin  EC 81 MG tablet Take 1 tablet (81 mg total) by mouth daily. Start taking when you are [redacted] weeks pregnant for rest of pregnancy for prevention of preeclampsia   Blood Glucose Monitoring Suppl (ACCU-CHEK GUIDE) w/Device KIT 1 Device by Does not apply route 4 (four) times daily. Use 4 times daily as instructed   famotidine  (PEPCID ) 20 MG tablet Take 1 tablet (20 mg total) by mouth 2 (two) times daily.   ferrous sulfate  325 (65 FE) MG tablet Take 1 tablet (325 mg total) by mouth every other day.   glucose blood (ACCU-CHEK GUIDE TEST) test strip 4 times daily   ondansetron  (ZOFRAN -ODT) 4 MG disintegrating tablet Take 1 tablet (4 mg total) by mouth every 6 (six) hours as needed for nausea.   Prenatal  Vit-Fe Fumarate-FA (PRENATAL VITAMIN PLUS LOW IRON PO) Take 1 tablet by mouth daily.   ursodiol  (ACTIGALL ) 300 MG capsule Take 1 capsule (300 mg total) by mouth 2 (two) times daily.   "

## 2024-06-13 NOTE — Progress Notes (Unsigned)
 Enrolled patient for a 7 day Zio XT monitor to be mailed to patients home.

## 2024-06-16 ENCOUNTER — Encounter: Payer: Self-pay | Admitting: Obstetrics and Gynecology

## 2024-06-16 ENCOUNTER — Ambulatory Visit: Payer: MEDICAID | Admitting: Obstetrics and Gynecology

## 2024-06-16 ENCOUNTER — Ambulatory Visit: Payer: MEDICAID | Attending: Obstetrics and Gynecology | Admitting: *Deleted

## 2024-06-16 ENCOUNTER — Ambulatory Visit: Payer: MEDICAID

## 2024-06-16 VITALS — BP 110/60 | HR 74

## 2024-06-16 VITALS — BP 109/66 | HR 82 | Wt 127.6 lb

## 2024-06-16 DIAGNOSIS — Z3A32 32 weeks gestation of pregnancy: Secondary | ICD-10-CM | POA: Diagnosis not present

## 2024-06-16 DIAGNOSIS — Z3480 Encounter for supervision of other normal pregnancy, unspecified trimester: Secondary | ICD-10-CM

## 2024-06-16 DIAGNOSIS — M5442 Lumbago with sciatica, left side: Secondary | ICD-10-CM | POA: Diagnosis not present

## 2024-06-16 DIAGNOSIS — O26643 Intrahepatic cholestasis of pregnancy, third trimester: Secondary | ICD-10-CM | POA: Diagnosis not present

## 2024-06-16 DIAGNOSIS — K831 Obstruction of bile duct: Secondary | ICD-10-CM | POA: Insufficient documentation

## 2024-06-16 DIAGNOSIS — R002 Palpitations: Secondary | ICD-10-CM | POA: Diagnosis not present

## 2024-06-16 DIAGNOSIS — O24419 Gestational diabetes mellitus in pregnancy, unspecified control: Secondary | ICD-10-CM | POA: Diagnosis not present

## 2024-06-16 DIAGNOSIS — O3503X0 Maternal care for (suspected) central nervous system malformation or damage in fetus, choroid plexus cysts, not applicable or unspecified: Secondary | ICD-10-CM

## 2024-06-16 DIAGNOSIS — O24410 Gestational diabetes mellitus in pregnancy, diet controlled: Secondary | ICD-10-CM | POA: Insufficient documentation

## 2024-06-16 DIAGNOSIS — Z3A31 31 weeks gestation of pregnancy: Secondary | ICD-10-CM | POA: Diagnosis not present

## 2024-06-16 MED ORDER — CYCLOBENZAPRINE HCL 10 MG PO TABS: 10.0000 mg | ORAL_TABLET | Freq: Three times a day (TID) | ORAL | 1 refills | Status: AC | PRN

## 2024-06-16 NOTE — Progress Notes (Signed)
 Pt presents for ROB visit. Pt c/o not feelinh herself today Pt reports lightheaded, heart palpation, shortness of breath and fatigue. Pt also numbness and tingling in the hands and left leg. Pt also report rash in her belly button

## 2024-06-16 NOTE — Procedures (Signed)
 Allison Trevino Apr 25, 2006 [redacted]w[redacted]d  Fetus A Non-Stress Test Interpretation for 06/16/2024  Indication: GDM-diet, Cholestasis  Fetal Heart Rate A Mode: External Baseline Rate (A): 145 bpm Variability: Moderate Accelerations: 15 x 15 Decelerations: None Multiple birth?: No  Uterine Activity Mode: Palpation, Toco Contraction Frequency (min): Occas UI Contraction Quality: Mild Resting Tone Palpated: Relaxed Resting Time: Adequate  Interpretation (Fetal Testing) Nonstress Test Interpretation: Reactive Comments: Dr. Arna reviewed tracing.

## 2024-06-22 DIAGNOSIS — O26649 Intrahepatic cholestasis of pregnancy, unspecified trimester: Secondary | ICD-10-CM | POA: Insufficient documentation

## 2024-06-22 NOTE — Progress Notes (Unsigned)
" ° °  PRENATAL VISIT NOTE  Subjective:  Allison Trevino is a 18 y.o. G2P0010 at [redacted]w[redacted]d being seen today for ongoing prenatal care.  She is currently monitored for the following issues for this {Blank single:19197::high-risk,low-risk} pregnancy and has Supervision of other normal pregnancy, antepartum; Rubella non-immune status, antepartum; Choroid plexus cyst of fetus in singleton pregnancy; Anemia; Pruritus of pregnancy, second trimester; and Gestational diabetes mellitus (GDM) affecting pregnancy on their problem list.  Patient reports not feeling herself, continued palpitations, met with Dr Sheena, awaiting zio monitor. She is not sleeping at night. She will have episodes that will come out of no where she doesn't feel good, will have palpitations and SOB. Continued to have itching .  Contractions: Not present. Vag. Bleeding: None.  Movement: Present. Denies leaking of fluid.   The following portions of the patient's history were reviewed and updated as appropriate: allergies, current medications, past family history, past medical history, past social history, past surgical history and problem list.   Objective:   Vitals:   06/16/24 1432  BP: 109/66  Pulse: 82  Weight: 127 lb 9.6 oz (57.9 kg)    Fetal Status:  Fetal Heart Rate (bpm): 154   Movement: Present    General: Alert, oriented and cooperative. Patient is in no acute distress.  Skin: Skin is warm and dry. No rash noted.   Cardiovascular: Normal heart rate noted  Respiratory: Normal respiratory effort, no problems with respiration noted  Abdomen: Soft, gravid, appropriate for gestational age.  Pain/Pressure: Present     Pelvic: Cervical exam deferred        Extremities: Normal range of motion.  Edema: None  Mental Status: Normal mood and affect. Normal behavior. Normal judgment and thought content.   Assessment and Plan:  Pregnancy: G2P0010 at [redacted]w[redacted]d 1. Supervision of other normal pregnancy, antepartum (Primary) BP and  FHR normal   2. Choroid plexus cyst of fetus in singleton pregnancy ***  3. Gestational diabetes mellitus (GDM) affecting pregnancy Reviewed log, fasting and postprandial sugars stable   4. Cholestasis during pregnancy in third trimester Symptoms had improved but resumed again, was taking actigall  and makes her sick every time she takes it. She cannot take hydroxyzine  due to side effects.   5. Intermittent palpitations ***  6. Acute left-sided low back pain with left-sided sciatica *** - cyclobenzaprine  (FLEXERIL ) 10 MG tablet; Take 1 tablet (10 mg total) by mouth every 8 (eight) hours as needed for muscle spasms.  Dispense: 30 tablet; Refill: 1   {Blank single:19197::Term,Preterm} labor symptoms and general obstetric precautions including but not limited to vaginal bleeding, contractions, leaking of fluid and fetal movement were reviewed in detail with the patient. Please refer to After Visit Summary for other counseling recommendations.   No follow-ups on file.  Future Appointments  Date Time Provider Department Center  06/30/2024 10:45 AM [facility] NST [facility]  06/30/2024  2:50 PM Delores Nidia CROME, FNP [facility] None  07/07/2024  3:15 PM [facility] PROVIDER 1 [facility]  07/07/2024  3:30 PM [facility] US3 [facility]  07/14/2024 10:45 AM [facility] NST [facility]  07/21/2024 10:15 AM [facility] PROVIDER 1 [facility]  07/21/2024 10:30 AM [facility] US4 [facility]    Nidia Delores, FNP  "

## 2024-06-25 ENCOUNTER — Encounter (HOSPITAL_COMMUNITY): Payer: Self-pay | Admitting: Obstetrics and Gynecology

## 2024-06-25 ENCOUNTER — Encounter: Payer: Self-pay | Admitting: Obstetrics and Gynecology

## 2024-06-25 ENCOUNTER — Inpatient Hospital Stay (HOSPITAL_COMMUNITY)
Admission: AD | Admit: 2024-06-25 | Discharge: 2024-06-25 | Disposition: A | Discharge status: Home / Self Care | Payer: MEDICAID | Source: Home / Self Care | Attending: Obstetrics and Gynecology | Admitting: Obstetrics and Gynecology

## 2024-06-25 DIAGNOSIS — Z3689 Encounter for other specified antenatal screening: Secondary | ICD-10-CM | POA: Diagnosis not present

## 2024-06-25 DIAGNOSIS — Z7982 Long term (current) use of aspirin: Secondary | ICD-10-CM | POA: Insufficient documentation

## 2024-06-25 DIAGNOSIS — O99343 Other mental disorders complicating pregnancy, third trimester: Secondary | ICD-10-CM | POA: Diagnosis not present

## 2024-06-25 DIAGNOSIS — O26643 Intrahepatic cholestasis of pregnancy, third trimester: Secondary | ICD-10-CM

## 2024-06-25 DIAGNOSIS — D649 Anemia, unspecified: Secondary | ICD-10-CM

## 2024-06-25 DIAGNOSIS — M545 Low back pain, unspecified: Secondary | ICD-10-CM | POA: Diagnosis present

## 2024-06-25 DIAGNOSIS — M5442 Lumbago with sciatica, left side: Secondary | ICD-10-CM | POA: Insufficient documentation

## 2024-06-25 DIAGNOSIS — I959 Hypotension, unspecified: Secondary | ICD-10-CM | POA: Diagnosis not present

## 2024-06-25 DIAGNOSIS — O99891 Other specified diseases and conditions complicating pregnancy: Secondary | ICD-10-CM | POA: Insufficient documentation

## 2024-06-25 DIAGNOSIS — Z3A32 32 weeks gestation of pregnancy: Secondary | ICD-10-CM | POA: Diagnosis not present

## 2024-06-25 DIAGNOSIS — F419 Anxiety disorder, unspecified: Secondary | ICD-10-CM

## 2024-06-25 DIAGNOSIS — R072 Precordial pain: Secondary | ICD-10-CM

## 2024-06-25 DIAGNOSIS — Z79899 Other long term (current) drug therapy: Secondary | ICD-10-CM | POA: Insufficient documentation

## 2024-06-25 DIAGNOSIS — O99013 Anemia complicating pregnancy, third trimester: Secondary | ICD-10-CM | POA: Diagnosis not present

## 2024-06-25 DIAGNOSIS — O24419 Gestational diabetes mellitus in pregnancy, unspecified control: Secondary | ICD-10-CM | POA: Diagnosis not present

## 2024-06-25 DIAGNOSIS — O26893 Other specified pregnancy related conditions, third trimester: Secondary | ICD-10-CM | POA: Diagnosis present

## 2024-06-25 DIAGNOSIS — R000 Tachycardia, unspecified: Secondary | ICD-10-CM | POA: Insufficient documentation

## 2024-06-25 DIAGNOSIS — R002 Palpitations: Secondary | ICD-10-CM | POA: Diagnosis not present

## 2024-06-25 DIAGNOSIS — O3500X0 Maternal care for (suspected) central nervous system malformation or damage in fetus, unspecified, not applicable or unspecified: Secondary | ICD-10-CM | POA: Diagnosis not present

## 2024-06-25 LAB — CBC
HCT: 27.8 % — ABNORMAL LOW (ref 36.0–46.0)
Hemoglobin: 9.1 g/dL — ABNORMAL LOW (ref 12.0–15.0)
MCH: 29.4 pg (ref 26.0–34.0)
MCHC: 32.7 g/dL (ref 30.0–36.0)
MCV: 90 fL (ref 80.0–100.0)
Platelets: 251 K/uL (ref 150–400)
RBC: 3.09 MIL/uL — ABNORMAL LOW (ref 3.87–5.11)
RDW: 13.4 % (ref 11.5–15.5)
WBC: 9.3 K/uL (ref 4.0–10.5)
nRBC: 0 % (ref 0.0–0.2)

## 2024-06-25 LAB — COMPREHENSIVE METABOLIC PANEL WITH GFR
ALT: 10 U/L (ref 0–44)
AST: 17 U/L (ref 15–41)
Albumin: 3.5 g/dL (ref 3.5–5.0)
Alkaline Phosphatase: 138 U/L — ABNORMAL HIGH (ref 38–126)
Anion gap: 14 (ref 5–15)
BUN: 9 mg/dL (ref 6–20)
CO2: 18 mmol/L — ABNORMAL LOW (ref 22–32)
Calcium: 8.5 mg/dL — ABNORMAL LOW (ref 8.9–10.3)
Chloride: 102 mmol/L (ref 98–111)
Creatinine, Ser: 0.36 mg/dL — ABNORMAL LOW (ref 0.44–1.00)
GFR, Estimated: >60 mL/min
Glucose, Bld: 75 mg/dL (ref 70–99)
Potassium: 3.5 mmol/L (ref 3.5–5.1)
Sodium: 134 mmol/L — ABNORMAL LOW (ref 135–145)
Total Bilirubin: 0.4 mg/dL (ref 0.0–1.2)
Total Protein: 6.3 g/dL — ABNORMAL LOW (ref 6.5–8.1)

## 2024-06-25 LAB — URINALYSIS, ROUTINE W REFLEX MICROSCOPIC
Bilirubin Urine: NEGATIVE
Glucose, UA: NEGATIVE mg/dL
Hgb urine dipstick: NEGATIVE
Ketones, ur: 20 mg/dL — AB
Leukocytes,Ua: NEGATIVE
Nitrite: NEGATIVE
Protein, ur: 30 mg/dL — AB
Specific Gravity, Urine: 1.024 (ref 1.005–1.030)
pH: 5 (ref 5.0–8.0)

## 2024-06-25 LAB — PRO BRAIN NATRIURETIC PEPTIDE: Pro Brain Natriuretic Peptide: <50 pg/mL

## 2024-06-25 MED ORDER — CYCLOBENZAPRINE HCL 10 MG PO TABS
10.0000 mg | ORAL_TABLET | Freq: Once | ORAL | Status: AC
  Administered 2024-06-25: 10 mg via ORAL
  Filled 2024-06-25: qty 1

## 2024-06-25 MED ORDER — PROPRANOLOL HCL 10 MG PO TABS: 10.0000 mg | ORAL_TABLET | Freq: Three times a day (TID) | ORAL | 1 refills | Status: DC | PRN

## 2024-06-25 MED ORDER — FERRIC MALTOL 30 MG PO CAPS: 1.0000 | ORAL_CAPSULE | Freq: Two times a day (BID) | ORAL | 2 refills | Status: AC

## 2024-06-25 MED ORDER — ACETAMINOPHEN 500 MG PO TABS
1000.0000 mg | ORAL_TABLET | Freq: Once | ORAL | Status: AC
  Administered 2024-06-25: 1000 mg via ORAL
  Filled 2024-06-25: qty 2

## 2024-06-25 MED ORDER — PROPRANOLOL HCL 10 MG PO TABS
10.0000 mg | ORAL_TABLET | Freq: Once | ORAL | Status: AC
  Administered 2024-06-25: 10 mg via ORAL
  Filled 2024-06-25: qty 1

## 2024-06-25 MED ORDER — LACTATED RINGERS IV BOLUS
1000.0000 mL | Freq: Once | INTRAVENOUS | Status: AC
  Administered 2024-06-25: 1000 mL via INTRAVENOUS

## 2024-06-25 MED ORDER — FAMOTIDINE IN NACL 20-0.9 MG/50ML-% IV SOLN
20.0000 mg | Freq: Once | INTRAVENOUS | Status: AC
  Administered 2024-06-25: 20 mg via INTRAVENOUS
  Filled 2024-06-25: qty 50

## 2024-06-25 NOTE — MAU Provider Note (Signed)
 Chief Complaint:  Chest Pain, Back Pain, and Abdominal Cramping   HPI    Allison Trevino is a 18 y.o. G2P0010 at [redacted]w[redacted]d who presents to maternity admissions reporting she is complaining of chest pain and tightening today.  Patient reports this started around 1700 yesterday.  She states nothing makes it better or worse she also reports some cramping since Monday intermittent and lower back pain since yesterday.  She denies any vaginal bleeding, leaking of fluid and reports good fetal movements.  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   Anxiety state 04/08/2019   Depression    History of suicidal ideation    IBS (irritable bowel syndrome)    Insomnia 04/08/2019   Migraines    Seasonal allergies    Severe anxiety    followed by dr macarthur piety   OB History  Gravida Para Term Preterm AB Living  2 0 0 0 1 0  SAB IAB Ectopic Multiple Live Births  1 0 0 0 0    # Outcome Date GA Lbr Len/2nd Weight Sex Type Anes PTL Lv  2 Current           1 SAB 12/06/22 [redacted]w[redacted]d          Past Surgical History:  Procedure Laterality Date   CHOLECYSTECTOMY N/A 10/14/2023   Procedure: LAPAROSCOPIC CHOLECYSTECTOMY;  Surgeon: Belinda Cough, MD;  Location: [facility];  Service: General;  Laterality: N/A;   COLONOSCOPY N/A 10/11/2023   Procedure: COLONOSCOPY;  Surgeon: Dianna Specking, MD;  Location: [facility];  Service: Gastroenterology;  Laterality: N/A;   DILATION AND EVACUATION N/A 12/06/2022   Procedure: DILATATION AND EVACUATION;  Surgeon: Erik Kieth BROCKS, MD;  Location: [facility];  Service: Gynecology;  Laterality: N/A;   Family History  Problem Relation Age of Onset   Diabetes Mother    Healthy Father    Hypertension Neg Hx    Cancer Neg Hx    Social History[1] Allergies[2] Medications Prior to Admission  Medication Sig Dispense Refill Last Dose/Taking   Accu-Chek Softclix Lancets lancets 4 times daily (Patient not taking: Reported on 06/16/2024) 100 each 12    acetaminophen  (TYLENOL ) 500 MG tablet Take 2 tablets (1,000 mg total) by mouth every 6 (six) hours as needed for mild pain (pain score 1-3).      aspirin  EC 81 MG tablet Take 1 tablet (81 mg total) by mouth daily. Start taking when you are [redacted] weeks pregnant for rest of pregnancy for prevention of preeclampsia 300 tablet 2    Blood Glucose Monitoring Suppl (ACCU-CHEK GUIDE) w/Device KIT 1 Device by Does not apply route 4 (four) times daily. Use 4 times daily as instructed 1 kit 0    cyclobenzaprine  (FLEXERIL ) 10 MG tablet Take 1 tablet (10 mg total) by mouth every 8 (eight) hours as needed for muscle spasms. 30 tablet 1    diphenhydrAMINE  (BENADRYL ) 50 MG tablet Take 0.5 tablets (25 mg total) by mouth every 8 (eight) hours as needed for itching. (Patient not taking: Reported on 06/16/2024) 10 tablet 0    famotidine  (PEPCID ) 20 MG tablet Take 1 tablet (20 mg total) by  mouth 2 (two) times daily. 60 tablet 2    ferrous sulfate  325 (65 FE) MG tablet Take 1 tablet (325 mg total) by mouth every other day. 90 tablet 1    glucose blood (ACCU-CHEK GUIDE TEST) test strip 4 times daily 100 each 12    ondansetron  (ZOFRAN -ODT) 4 MG disintegrating tablet Take 1 tablet (4 mg total) by mouth every 6 (six) hours as needed for nausea. 20 tablet 0    Prenatal Vit-Fe Fumarate-FA (PRENATAL VITAMIN PLUS LOW IRON PO) Take 1 tablet by mouth daily.      propranolol  (INDERAL ) 10 MG tablet Take 10 mg by mouth daily as needed (anxiety). (Patient not taking: Reported on 06/16/2024)      simethicone  (MYLICON) 80 MG chewable  tablet Chew 1 tablet (80 mg total) by mouth every 6 (six) hours as needed. (Patient not taking: Reported on 06/16/2024) 30 tablet 0    ursodiol  (ACTIGALL ) 300 MG capsule Take 1 capsule (300 mg total) by mouth 2 (two) times daily. 60 capsule 3     I have reviewed patient's Past Medical Hx, Surgical Hx, Family Hx, Social Hx, medications and allergies.   ROS  Pertinent items noted in HPI and remainder of comprehensive ROS otherwise negative.   PHYSICAL EXAM  Patient Vitals for the past 24 hrs:  BP Pulse Resp SpO2 Height Weight  06/25/24 1948 (!) 118/59 85 -- -- -- --  06/25/24 1852 -- -- 18 -- -- --  06/25/24 1741 115/72 71 -- -- -- --  06/25/24 1738 116/66 77 -- -- -- --  06/25/24 1737 115/63 81 -- -- -- --  06/25/24 1736 117/74 76 -- -- -- --  06/25/24 1612 (!) 94/44 69 -- -- -- --  06/25/24 1607 (!) 83/48 91 -- -- -- --  06/25/24 1606 (!) 67/45 (!) 117 -- -- -- --  06/25/24 1605 (!) 81/46 (!) 124 -- 100 % -- --  06/25/24 1600 118/67 91 -- 94 % -- --  06/25/24 1540 -- -- -- -- 4' 11 (1.499 m) 53.5 kg    Constitutional: Well-developed, well-nourished female in no acute distress but appears pallor  Cardiovascular: Tachycardic and hypotensive Respiratory: normal effort, no problems with respiration noted, lungs BCTA GI: Abd soft, non-tender, gravid MS: Extremities nontender, no edema, normal ROM Neurologic: Alert and oriented x 4.      Fetal Tracing: NST Reactive Baseline: 130-135 Variability:moderate  Accelerations: present Decelerations: absent Toco: no ctx   Labs: Results for orders placed or performed during the hospital encounter of 06/25/24 (from the past 24 hours)  Urinalysis, Routine w reflex microscopic -Urine, Clean Catch     Status: Abnormal   Collection Time: 06/25/24  4:06 PM  Result Value Ref Range   Color, Urine YELLOW YELLOW   APPearance HAZY (A) CLEAR   Specific Gravity, Urine 1.024 1.005 - 1.030   pH 5.0 5.0 - 8.0   Glucose, UA NEGATIVE NEGATIVE mg/dL    Hgb urine dipstick NEGATIVE NEGATIVE   Bilirubin Urine NEGATIVE NEGATIVE   Ketones, ur 20 (A) NEGATIVE mg/dL   Protein, ur 30 (A) NEGATIVE mg/dL   Nitrite NEGATIVE NEGATIVE   Leukocytes,Ua NEGATIVE NEGATIVE   RBC / HPF 0-5 0 - 5 RBC/hpf   WBC, UA 6-10 0 - 5 WBC/hpf   Bacteria, UA MANY (A) NONE SEEN   Squamous Epithelial / HPF 0-5 0 - 5 /HPF   Mucus PRESENT    Hyaline Casts, UA PRESENT   CBC     Status:  Abnormal   Collection Time: 06/25/24  4:21 PM  Result Value Ref Range   WBC 9.3 4.0 - 10.5 K/uL   RBC 3.09 (L) 3.87 - 5.11 MIL/uL   Hemoglobin 9.1 (L) 12.0 - 15.0 g/dL   HCT 72.1 (L) 63.9 - 53.9 %   MCV 90.0 80.0 - 100.0 fL   MCH 29.4 26.0 - 34.0 pg   MCHC 32.7 30.0 - 36.0 g/dL   RDW 86.5 88.4 - 84.4 %   Platelets 251 150 - 400 K/uL   nRBC 0.0 0.0 - 0.2 %  Comprehensive metabolic panel     Status: Abnormal   Collection Time: 06/25/24  4:21 PM  Result Value Ref Range   Sodium 134 (L) 135 - 145 mmol/L   Potassium 3.5 3.5 - 5.1 mmol/L   Chloride 102 98 - 111 mmol/L   CO2 18 (L) 22 - 32 mmol/L   Glucose, Bld 75 70 - 99 mg/dL   BUN 9 6 - 20 mg/dL   Creatinine, Ser 9.63 (L) 0.44 - 1.00 mg/dL   Calcium 8.5 (L) 8.9 - 10.3 mg/dL   Total Protein 6.3 (L) 6.5 - 8.1 g/dL   Albumin 3.5 3.5 - 5.0 g/dL   AST 17 15 - 41 U/L   ALT 10 0 - 44 U/L   Alkaline Phosphatase 138 (H) 38 - 126 U/L   Total Bilirubin 0.4 0.0 - 1.2 mg/dL   GFR, Estimated >39 >39 mL/min   Anion gap 14 5 - 15  Pro Brain natriuretic peptide     Status: None   Collection Time: 06/25/24  4:21 PM  Result Value Ref Range   Pro Brain Natriuretic Peptide <50.0 <300.0 pg/mL    Imaging:  No results found.  MDM & MAU COURSE  MDM:  HIGH   Prenatal chart reviewed Physical exam performed The following labs were ordered for hypotension and for complaints of chest pain to rule out electrolyte imbalance or acute pathology CBC: Hgb 9.1 ( Anemia) CMP: no acute findings BNP: <50 UA: Proteinuria and ketones ( likely c/w  dehydration ) IVF given Bile acids for ongoing itching and cholestasis of pregnancy ( Pending)  EKG for complaints of chest pain ( Sinus Tachardia) Orthostatic blood pressures IV fluids for hypotension Pepcid  for likely GERD versus heartburn in pregnancy  Patient reassessed at 1841  Still c/o substernal pain (Tylenol /Flexeril  ordered for 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)   MAU Course: Orders Placed This Encounter  Procedures   Urinalysis, Routine w reflex microscopic -Urine, Clean Catch   CBC   Comprehensive metabolic panel   Pro Brain natriuretic peptide   Bile acids, total   Orthostatic vital signs   EKG 12-Lead   Discharge patient Discharge disposition: 01-Home or Self Care; Discharge patient date: 06/25/2024   Meds ordered this encounter  Medications   lactated ringers  bolus 1,000 mL   famotidine  (PEPCID ) IVPB 20 mg premix   acetaminophen  (TYLENOL ) tablet 1,000 mg   cyclobenzaprine  (FLEXERIL ) tablet 10 mg   propranolol  (INDERAL ) tablet 10 mg   Ferric Maltol 30 MG CAPS    Sig: Take 1 capsule (30 mg total) by mouth 2 (two) times daily. Please take one hour before breakfast and dinner    Dispense:  60 capsule    Refill:  2    Supervising Provider:   PRATT, TANYA S [[id number]]    I have reviewed the patient chart and performed the physical exam . I have ordered & interpreted the lab results and reviewed and interpreted  the NST  Medications ordered as stated above/ below.  A/P as described below.  Counseling and education provided regarding palpitations, hypotension, anemia, and costal carditis in pregnancy.   No acute findings at this time.  Will plan to have patient follow-up with Dr. 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 Mother reports this presentation  has happened numerous times before and she can not take atarax   or any SSRI's due to reactions. Patient also sees [facility] weekly for therapy no medication management at this time.  ASSESSMENT   1. Substernal chest pain   2. Cholestasis during pregnancy in third trimester   3. Palpitations   4. NST (non-stress test) reactive on fetal surveillance   5. [redacted] weeks gestation of pregnancy   6. Anxiety     PLAN  Discharge home in stable condition with return precautions.   See AVS for full description of information given to the patient including both verbal and written. Patient verbalized understanding and agrees with the plan as described above.     Follow-up Information     [facility] Follow up.   Specialty: Obstetrics and Gynecology Why: If symptoms worsen or fail to resolve, As scheduled for ongoing prenatal care Contact information: [address] [place]  [address] [phone]                Allergies as of 06/25/2024       Reactions   Hydroxyzine  Other (See Comments)   Suicidal Ideation, Hallucinations   Azithromycin  Itching, Nausea And Vomiting   Severe N/V and severe itching   Lactose Intolerance (gi) Other (See Comments)   GI Upset   Metronidazole  Itching   Hydromorphone  Rash   Possible allergy to opiates including oxycodone  / dilaudid  / Fentanyl . Received opiate medications (dilaudid , fentanyl ) as well as Augmentin  on 4/15 developed rash. On 4/16 received Oxycodone  and Ceftriaxone  around same time + developed recurrent rash / itching.        Medication List     TAKE these medications    Accu-Chek Guide Test test strip Generic drug: glucose blood 4 times daily   Accu-Chek Guide  w/Device Kit 1 Device by Does not apply route 4 (four) times daily. Use 4 times daily as instructed   Accu-Chek Softclix Lancets lancets 4 times daily   acetaminophen  500 MG tablet Commonly known as: TYLENOL  Take 2 tablets (1,000 mg total) by mouth every 6 (six) hours as needed for mild pain (pain score 1-3).   aspirin  EC 81 MG tablet Take 1 tablet (81 mg total) by mouth daily. Start taking when you are [redacted] weeks pregnant for rest of pregnancy for prevention of preeclampsia   cyclobenzaprine  10 MG tablet Commonly known as: FLEXERIL  Take 1 tablet (10 mg total) by mouth every 8 (eight) hours as needed for muscle spasms.   diphenhydrAMINE  50 MG tablet Commonly known as: BENADRYL  Take 0.5 tablets (25 mg total) by mouth every 8 (eight) hours as needed for itching.   famotidine  20  MG tablet Commonly known as: Pepcid  Take 1 tablet (20 mg total) by mouth 2 (two) times daily.   Ferric Maltol 30 MG Caps Take 1 capsule (30 mg total) by mouth 2 (two) times daily. Please take one hour before breakfast and dinner   ferrous sulfate  325 (65 FE) MG tablet Take 1 tablet (325 mg total) by mouth every other day.   ondansetron  4 MG disintegrating tablet Commonly known as: ZOFRAN -ODT Take 1 tablet (4 mg total) by mouth every 6 (six) hours as needed for nausea.   PRENATAL VITAMIN PLUS LOW IRON PO Take 1 tablet by mouth daily.   propranolol  10 MG tablet Commonly known as: INDERAL  Take 10 mg by mouth daily as needed (anxiety).   simethicone  80 MG chewable tablet Commonly known as: MYLICON Chew 1 tablet (80 mg total) by mouth every 6 (six) hours as needed.   ursodiol  300 MG capsule Commonly known as: ACTIGALL  Take 1 capsule (300 mg total) by mouth 2 (two) times daily.        Olam Dalton, MSN, WHNP-BC [facility], [facility] [employer]       [1]  Social History Tobacco Use   Smoking status: Never    Passive exposure: Current   Smokeless tobacco: Never   Vaping Use   Vaping status: Never Used  Substance Use Topics   Alcohol use: Never   Drug use: Not Currently    Types: Marijuana    Comment: pos drug screen. Has not used since couple of weeks  [2]  Allergies Allergen Reactions   Hydroxyzine  Other (See Comments)    Suicidal Ideation, Hallucinations   Azithromycin  Itching and Nausea And Vomiting    Severe N/V and severe itching   Lactose Intolerance (Gi) Other (See Comments)    GI Upset   Metronidazole  Itching   Hydromorphone  Rash    Possible allergy to opiates including oxycodone  / dilaudid  / Fentanyl . Received opiate medications (dilaudid , fentanyl ) as well as Augmentin  on 4/15 developed rash. On 4/16 received Oxycodone  and Ceftriaxone  around same time + developed recurrent rash / itching.

## 2024-06-25 NOTE — MAU Note (Signed)
 Allison Trevino is a 18 y.o. at [redacted]w[redacted]d here in MAU reporting: chest pain and tightening like something is going to come out of my chest, intermittent, since approx 1700 yesterday. Nothing makes it better or worse. Also reports cramping since Monday, intermittent, and lower back pain since yesterday. Reports normal FM, denies LOF, reports clear discharge.   LMP: 08/14/24 Onset of complaint: 06/24/24 Pain score: 10/10 (chest), 7/10 (back), 8/10 (cramping) Vitals:   06/25/24 1741 06/25/24 1852  BP: 115/72   Pulse: 71   Resp:  (P) 18  SpO2:       FHT: 150  Lab orders placed from triage:

## 2024-06-26 NOTE — L&D Delivery Note (Signed)
 OB/[facility]N Faculty Practice Delivery Note  Vesta Wheeland is a 19 y.o. G2P0010 s/p SVD at [redacted]w[redacted]d. She was admitted for IOL d/t cholestasis.   ROM: 20h 30m with clear fluid GBS Status: Negative/-- (01/20 9072)    Labor Progress: Initial SVE: FT/thick/-3. She then progressed to complete.   Delivery Date/Time: 07/25/2024 at 2232  Delivery: Called to room and patient was complete and pushing. Head delivered LOA. Nuchal cord present x1. Shoulder and body delivered in usual fashion. Infant with spontaneous cry, placed on mother's abdomen, dried and stimulated. Cord clamped x 2 after 1-minute delay, and cut by mother's friend. Placenta delivered spontaneously with gentle cord traction. Fundus firm with massage and Pitocin . Labia, perineum, vagina, and cervix inspected inspected with 1st degree perineal laceration noted. Baby Weight: pending  Placenta: Sent to L&D Complications: None Lacerations: 1st degree perineal, hemostatic EBL: 400 mL Analgesia: Epidural   Infant:  APGAR (1 MIN):  7 APGAR (5 MINS):  8

## 2024-06-27 ENCOUNTER — Ambulatory Visit: Payer: Self-pay | Admitting: Nurse Practitioner

## 2024-06-27 LAB — BILE ACIDS, TOTAL

## 2024-06-27 NOTE — Progress Notes (Signed)
 Bile Acids unable to be resulted due to inadequate specimen received on 06/25/24

## 2024-06-30 ENCOUNTER — Encounter: Payer: Self-pay | Admitting: Obstetrics and Gynecology

## 2024-06-30 ENCOUNTER — Other Ambulatory Visit: Payer: MEDICAID

## 2024-06-30 ENCOUNTER — Ambulatory Visit (INDEPENDENT_AMBULATORY_CARE_PROVIDER_SITE_OTHER): Payer: MEDICAID | Admitting: Obstetrics and Gynecology

## 2024-06-30 ENCOUNTER — Ambulatory Visit: Payer: MEDICAID | Attending: Obstetrics and Gynecology | Admitting: *Deleted

## 2024-06-30 VITALS — BP 104/57 | HR 69

## 2024-06-30 VITALS — BP 108/72 | HR 91 | Wt 130.2 lb

## 2024-06-30 DIAGNOSIS — O99019 Anemia complicating pregnancy, unspecified trimester: Secondary | ICD-10-CM

## 2024-06-30 DIAGNOSIS — Z2839 Other underimmunization status: Secondary | ICD-10-CM

## 2024-06-30 DIAGNOSIS — Z3480 Encounter for supervision of other normal pregnancy, unspecified trimester: Secondary | ICD-10-CM

## 2024-06-30 DIAGNOSIS — Z3A33 33 weeks gestation of pregnancy: Secondary | ICD-10-CM | POA: Insufficient documentation

## 2024-06-30 DIAGNOSIS — O26643 Intrahepatic cholestasis of pregnancy, third trimester: Secondary | ICD-10-CM

## 2024-06-30 DIAGNOSIS — O99013 Anemia complicating pregnancy, third trimester: Secondary | ICD-10-CM | POA: Diagnosis not present

## 2024-06-30 DIAGNOSIS — O24419 Gestational diabetes mellitus in pregnancy, unspecified control: Secondary | ICD-10-CM | POA: Diagnosis not present

## 2024-06-30 DIAGNOSIS — R002 Palpitations: Secondary | ICD-10-CM | POA: Diagnosis not present

## 2024-06-30 DIAGNOSIS — Z2911 Encounter for prophylactic immunotherapy for respiratory syncytial virus (RSV): Secondary | ICD-10-CM

## 2024-06-30 NOTE — Progress Notes (Signed)
 Pt presents for ROB visit. Pt was seen at MAU 06-25-25

## 2024-06-30 NOTE — Progress Notes (Signed)
 "  PRENATAL VISIT NOTE  Subjective:  Allison Trevino is a 19 y.o. G2P0010 at [redacted]w[redacted]d being seen today for ongoing prenatal care.  She is currently monitored for the following issues for this high-risk pregnancy and has Supervision of other normal pregnancy, antepartum; Rubella non-immune status, antepartum; Choroid plexus cyst of fetus in singleton pregnancy; Anemia; Pruritus of pregnancy, second trimester; Gestational diabetes mellitus (GDM) affecting pregnancy; Cholestasis of pregnancy; and Anemia affecting pregnancy, antepartum on their problem list.  Patient reports 06/24/25 seen in [facility]  initially for chest pain she had an episode where she had hypotension and they had to put her in trendelenburg. She is awaiting the results of zio monitor.  She reports continued itching body, hand and feet worse at night.  Contractions: Irritability. Vag. Bleeding: None.  Movement: Present. Denies leaking of fluid.   The following portions of the patient's history were reviewed and updated as appropriate: allergies, current medications, past family history, past medical history, past social history, past surgical history and problem list.   Objective:   Vitals:   06/30/24 1500  BP: 108/72  Pulse: 91  Weight: 130 lb 3.2 oz (59.1 kg)    Fetal Status:  Fetal Heart Rate (bpm): 145   Movement: Present    General: Alert, oriented and cooperative. Patient is in no acute distress.  Skin: Skin is warm and dry. No rash noted.   Cardiovascular: Normal heart rate noted  Respiratory: Normal respiratory effort, no problems with respiration noted  Abdomen: Soft, gravid, appropriate for gestational age.  Pain/Pressure: Present     Pelvic: Cervical exam deferred        Extremities: Normal range of motion.  Edema: None  Mental Status: Normal mood and affect. Normal behavior. Normal judgment and thought content.   Assessment and Plan:  Pregnancy: G2P0010 at [redacted]w[redacted]d 1. Supervision of other normal pregnancy,  antepartum (Primary) BP and FHR normal today  RSV given today  - Respiratory syncytial virus vaccine, preF, subunit, bivalent,(Abrysvo)  2. Cholestasis during pregnancy in third trimester Continued symptoms last bile acids checked was on Actigall , is having trouble tolerating Actigall  even with food and nausea meds Bile acids at hospital the other day did not have sufficient amount, rechecking today  Encouraged Actigall  as much as possible She has ultrasound on 1/12 Had questions about IOL, schedule today for 37 weeks pending clinical course Continue growth and antenatal testing  - Bile acids, total  3. Gestational diabetes mellitus (GDM) affecting pregnancy Controlled with diet outlier sugar 125 after meals otherwise normal   4. Anemia affecting pregnancy, antepartum Hgb 9.1 12/31, discussed iron infusions given symptoms  Orders placed today   5. Rubella non-immune status, antepartum MMR pp   6. Palpitations Following Dr. Sheena, she completed zio patch and is awaiting the results These symptoms come periodically even at rest,  she has a hx of anxiety but she and her mom reports these symptoms are different   7. [redacted] weeks gestation of pregnancy RSV today    Preterm labor symptoms and general obstetric precautions including but not limited to vaginal bleeding, contractions, leaking of fluid and fetal movement were reviewed in detail with the patient. Please refer to After Visit Summary for other counseling recommendations.    Future Appointments  Date Time Provider Department Center  07/07/2024  3:15 PM [facility] PROVIDER 1 [facility]  07/07/2024  3:30 PM [facility] US3 [facility]  07/14/2024 10:45 AM [facility] NST [facility]  07/14/2024  3:10 PM Rudy Carlin LABOR, MD  [facility] None  07/21/2024 10:15 AM [facility] PROVIDER 1 [facility]  07/21/2024 10:30 AM [facility] US4 [facility]  07/24/2024 12:00 AM [facility] SCHED ROOM [facility] None    Nidia Daring, FNP  "

## 2024-06-30 NOTE — Procedures (Signed)
 Allison Trevino 07-28-2005 [redacted]w[redacted]d  Fetus A Non-Stress Test Interpretation for 06/30/2024 (NST only)  Indication: GDM, Prob cholestasis  Fetal Heart Rate A Mode: External Baseline Rate (A): 135 bpm Variability: Moderate Accelerations: 15 x 15 Decelerations: None Multiple birth?: No  Uterine Activity Mode: Palpation, Toco Contraction Frequency (min): UI Contraction Quality: Mild Resting Tone Palpated: Relaxed Resting Time: Adequate  Interpretation (Fetal Testing) Nonstress Test Interpretation: Reactive Comments: Dr. Arna reviewed tracing.

## 2024-07-01 ENCOUNTER — Other Ambulatory Visit (HOSPITAL_COMMUNITY): Payer: Self-pay | Admitting: Obstetrics and Gynecology

## 2024-07-01 ENCOUNTER — Encounter: Payer: Self-pay | Admitting: Obstetrics and Gynecology

## 2024-07-01 DIAGNOSIS — O99019 Anemia complicating pregnancy, unspecified trimester: Secondary | ICD-10-CM | POA: Insufficient documentation

## 2024-07-02 LAB — BILE ACIDS, TOTAL: Bile Acids Total: 3.4 umol/L (ref 0.0–10.0)

## 2024-07-03 ENCOUNTER — Ambulatory Visit (INDEPENDENT_AMBULATORY_CARE_PROVIDER_SITE_OTHER): Payer: MEDICAID

## 2024-07-03 ENCOUNTER — Ambulatory Visit: Payer: Self-pay | Admitting: Cardiology

## 2024-07-03 ENCOUNTER — Telehealth: Payer: Self-pay | Admitting: Pharmacy Technician

## 2024-07-03 VITALS — BP 110/71 | HR 86 | Temp 97.4°F | Resp 16 | Ht 59.0 in | Wt 129.2 lb

## 2024-07-03 DIAGNOSIS — O99019 Anemia complicating pregnancy, unspecified trimester: Secondary | ICD-10-CM | POA: Diagnosis not present

## 2024-07-03 DIAGNOSIS — R002 Palpitations: Secondary | ICD-10-CM | POA: Diagnosis not present

## 2024-07-03 DIAGNOSIS — Z3A34 34 weeks gestation of pregnancy: Secondary | ICD-10-CM | POA: Diagnosis not present

## 2024-07-03 MED ORDER — IRON SUCROSE 200 MG IVPB - SIMPLE MED
200.0000 mg | Freq: Once | Status: AC
  Administered 2024-07-03: 200 mg via INTRAVENOUS
  Filled 2024-07-03: qty 110

## 2024-07-03 NOTE — Telephone Encounter (Signed)
 Auth Submission: NO AUTH NEEDED Site of care: Site of care: [facility] Payer: [employer] Medication & CPT/J Code(s) submitted: Venofer  (Iron  Sucrose) J1756 Diagnosis Code: d50.9 Route of submission (phone, fax, portal):  Phone # Fax # Auth type: Buy/Bill PB Units/visits requested: 5 doses Reference number:  Approval from: 07/03/24 to 10/23/24

## 2024-07-03 NOTE — Progress Notes (Signed)
 Diagnosis: Iron  Deficiency Anemia  Provider:  Praveen Mannam MD  Procedure: IV Infusion  IV Type: Peripheral, IV Location: R Antecubital   Venofer  (Iron  Sucrose), Dose: 200 mg  Infusion Start Time: 1222  Infusion Stop Time: 1238  Post Infusion IV Care: Observation period completed and Peripheral IV Discontinued  Discharge: Condition: Good, Destination: Home . AVS Declined  Performed by:  Leita FORBES Miles, LPN

## 2024-07-07 ENCOUNTER — Ambulatory Visit (HOSPITAL_BASED_OUTPATIENT_CLINIC_OR_DEPARTMENT_OTHER): Payer: MEDICAID | Admitting: Obstetrics

## 2024-07-07 ENCOUNTER — Ambulatory Visit: Payer: MEDICAID | Attending: Obstetrics and Gynecology

## 2024-07-07 ENCOUNTER — Ambulatory Visit: Payer: MEDICAID

## 2024-07-07 ENCOUNTER — Other Ambulatory Visit: Payer: Self-pay | Admitting: Obstetrics

## 2024-07-07 VITALS — BP 120/70 | HR 91

## 2024-07-07 VITALS — BP 114/72 | HR 97 | Temp 97.6°F | Resp 16 | Ht 59.0 in | Wt 131.2 lb

## 2024-07-07 DIAGNOSIS — Z3A34 34 weeks gestation of pregnancy: Secondary | ICD-10-CM | POA: Diagnosis not present

## 2024-07-07 DIAGNOSIS — O24410 Gestational diabetes mellitus in pregnancy, diet controlled: Secondary | ICD-10-CM | POA: Insufficient documentation

## 2024-07-07 DIAGNOSIS — K831 Obstruction of bile duct: Secondary | ICD-10-CM | POA: Insufficient documentation

## 2024-07-07 DIAGNOSIS — O26643 Intrahepatic cholestasis of pregnancy, third trimester: Secondary | ICD-10-CM

## 2024-07-07 DIAGNOSIS — Z3480 Encounter for supervision of other normal pregnancy, unspecified trimester: Secondary | ICD-10-CM | POA: Diagnosis not present

## 2024-07-07 DIAGNOSIS — O24419 Gestational diabetes mellitus in pregnancy, unspecified control: Secondary | ICD-10-CM

## 2024-07-07 DIAGNOSIS — O99343 Other mental disorders complicating pregnancy, third trimester: Secondary | ICD-10-CM | POA: Diagnosis not present

## 2024-07-07 DIAGNOSIS — O2686 Pruritic urticarial papules and plaques of pregnancy (PUPPP): Secondary | ICD-10-CM

## 2024-07-07 DIAGNOSIS — Z362 Encounter for other antenatal screening follow-up: Secondary | ICD-10-CM | POA: Diagnosis not present

## 2024-07-07 DIAGNOSIS — O26613 Liver and biliary tract disorders in pregnancy, third trimester: Secondary | ICD-10-CM | POA: Diagnosis not present

## 2024-07-07 DIAGNOSIS — O358XX0 Maternal care for other (suspected) fetal abnormality and damage, not applicable or unspecified: Secondary | ICD-10-CM | POA: Insufficient documentation

## 2024-07-07 DIAGNOSIS — O2693 Pregnancy related conditions, unspecified, third trimester: Secondary | ICD-10-CM | POA: Insufficient documentation

## 2024-07-07 DIAGNOSIS — O99019 Anemia complicating pregnancy, unspecified trimester: Secondary | ICD-10-CM

## 2024-07-07 MED ORDER — IRON SUCROSE 200 MG IVPB - SIMPLE MED
200.0000 mg | Freq: Once | Status: AC
  Administered 2024-07-07: 200 mg via INTRAVENOUS

## 2024-07-07 NOTE — Progress Notes (Signed)
 MFM Consult Note  Allison Trevino is currently at [redacted]w[redacted]d. She has been followed due to a teenage pregnancy and diet-controlled gestational diabetes.    Her pregnancy has also been complicated by whole body itching.  She is currently taking Actigall .  However, her total bile acids remain in the normal range.  She reports that her fingerstick values have mostly been within normal limits and reports that she feels fetal movements throughout the day.    Her blood pressure today was 120/70.  Sonographic findings Single intrauterine pregnancy at 34w 4d. Fetal cardiac activity: Observed. Presentation: Cephalic. Fetal biometry shows the estimated fetal weight of 5 lb 14 oz,  2665g (70%). Amniotic fluid: Within normal limits. AFI: 15.38 cm.  MVP: 5.09 cm. Placenta: Posterior Fundal. BPP: 8/8.   Due to probable cholestasis of pregnancy and gestational diabetes, she already has an induction of labor scheduled at around 37 weeks on July 24, 2024.  She should continue weekly fetal testing until delivery.    She will return in 1 week for an NST.    Fetal kick count instructions were reviewed.  The patient stated that all of her questions were answered.   A total of 20 minutes was spent counseling and coordinating the care for this patient.  Greater than 50% of the time was spent in direct face-to-face contact.

## 2024-07-07 NOTE — Progress Notes (Signed)
 Diagnosis: Iron  Deficiency Anemia  Provider:  Mannam, Praveen MD  Procedure: IV Infusion  IV Type: Peripheral, IV Location: R Antecubital  Venofer  (Iron  Sucrose), Dose: 200 mg  Infusion Start Time: 1241  Infusion Stop Time: 1258   Post Infusion IV Care: Patient declined observation and Peripheral IV Discontinued  Discharge: Condition: Good, Destination: Home . AVS Declined  Performed by:  Jearline Hirschhorn E, RN

## 2024-07-09 ENCOUNTER — Ambulatory Visit: Payer: MEDICAID

## 2024-07-09 MED ORDER — IRON SUCROSE 200 MG IVPB - SIMPLE MED: 200.0000 mg | Freq: Once | Status: DC

## 2024-07-10 ENCOUNTER — Encounter: Payer: Self-pay | Admitting: Obstetrics and Gynecology

## 2024-07-11 ENCOUNTER — Ambulatory Visit: Payer: MEDICAID

## 2024-07-14 ENCOUNTER — Other Ambulatory Visit (HOSPITAL_COMMUNITY)
Admission: RE | Admit: 2024-07-14 | Discharge: 2024-07-14 | Disposition: A | Discharge status: Home / Self Care | Payer: MEDICAID | Source: Ambulatory Visit | Attending: Obstetrics | Admitting: Obstetrics

## 2024-07-14 ENCOUNTER — Ambulatory Visit: Payer: MEDICAID | Attending: Maternal & Fetal Medicine | Admitting: *Deleted

## 2024-07-14 ENCOUNTER — Ambulatory Visit: Payer: MEDICAID | Admitting: Obstetrics

## 2024-07-14 VITALS — BP 105/62 | HR 73

## 2024-07-14 VITALS — BP 123/71 | HR 92 | Wt 134.0 lb

## 2024-07-14 DIAGNOSIS — O26643 Intrahepatic cholestasis of pregnancy, third trimester: Secondary | ICD-10-CM | POA: Insufficient documentation

## 2024-07-14 DIAGNOSIS — Z2839 Other underimmunization status: Secondary | ICD-10-CM | POA: Diagnosis not present

## 2024-07-14 DIAGNOSIS — Z3A35 35 weeks gestation of pregnancy: Secondary | ICD-10-CM | POA: Insufficient documentation

## 2024-07-14 DIAGNOSIS — O99713 Diseases of the skin and subcutaneous tissue complicating pregnancy, third trimester: Secondary | ICD-10-CM | POA: Diagnosis not present

## 2024-07-14 DIAGNOSIS — L299 Pruritus, unspecified: Secondary | ICD-10-CM

## 2024-07-14 DIAGNOSIS — O0990 Supervision of high risk pregnancy, unspecified, unspecified trimester: Secondary | ICD-10-CM

## 2024-07-14 DIAGNOSIS — O0993 Supervision of high risk pregnancy, unspecified, third trimester: Secondary | ICD-10-CM

## 2024-07-14 DIAGNOSIS — O24410 Gestational diabetes mellitus in pregnancy, diet controlled: Secondary | ICD-10-CM | POA: Insufficient documentation

## 2024-07-14 DIAGNOSIS — O3503X0 Maternal care for (suspected) central nervous system malformation or damage in fetus, choroid plexus cysts, not applicable or unspecified: Secondary | ICD-10-CM | POA: Diagnosis not present

## 2024-07-14 DIAGNOSIS — O99013 Anemia complicating pregnancy, third trimester: Secondary | ICD-10-CM | POA: Diagnosis not present

## 2024-07-14 DIAGNOSIS — O99019 Anemia complicating pregnancy, unspecified trimester: Secondary | ICD-10-CM

## 2024-07-14 DIAGNOSIS — O24419 Gestational diabetes mellitus in pregnancy, unspecified control: Secondary | ICD-10-CM

## 2024-07-14 NOTE — Procedures (Signed)
 Allison Trevino Sep 16, 2005 [redacted]w[redacted]d  Fetus A Non-Stress Test Interpretation for 07/14/24 (NST only)  Indication: Cholestasis, GDM-diet  Fetal Heart Rate A Mode: External Baseline Rate (A): 150 bpm Variability: Moderate Accelerations: 15 x 15 Decelerations: None Multiple birth?: No  Uterine Activity Mode: Palpation, Toco Contraction Frequency (min): UI Contraction Quality: Mild Resting Tone Palpated: Relaxed Resting Time: Adequate  Interpretation (Fetal Testing) Nonstress Test Interpretation: Reactive Comments: Dr. William reviewed tracing.

## 2024-07-14 NOTE — Progress Notes (Signed)
 Subjective:  Allison Trevino is a 19 y.o. G2P0010 at [redacted]w[redacted]d being seen today for ongoing prenatal care.  She is currently monitored for the following issues for this high-risk pregnancy and has Supervision of other normal pregnancy, antepartum; Rubella non-immune status, antepartum; Choroid plexus cyst of fetus in singleton pregnancy; Anemia; Pruritus of pregnancy, second trimester; Gestational diabetes mellitus (GDM) affecting pregnancy; Cholestasis of pregnancy; and Anemia affecting pregnancy, antepartum on their problem list.  Patient reports no complaints.  Contractions: Not present. Vag. Bleeding: None.  Movement: Present. Denies leaking of fluid.   The following portions of the patient's history were reviewed and updated as appropriate: allergies, current medications, past family history, past medical history, past social history, past surgical history and problem list. Problem list updated.  Objective:   Vitals:   07/14/24 1517  BP: 123/71  Pulse: 92  Weight: 134 lb (60.8 kg)    Fetal Status: Fetal Heart Rate (bpm): 164   Movement: Present     General:  Alert, oriented and cooperative. Patient is in no acute distress.  Skin: Skin is warm and dry. No rash noted.   Cardiovascular: Normal heart rate noted  Respiratory: Normal respiratory effort, no problems with respiration noted  Abdomen: Soft, gravid, appropriate for gestational age. Pain/Pressure: Present     Pelvic:  Cervical exam performed      1 cm / 50% / -2 / Vtx  Extremities: Normal range of motion.  Edema: None  Mental Status: Normal mood and affect. Normal behavior. Normal judgment and thought content.   Urinalysis:      Assessment and Plan:  Pregnancy: G2P0010 at [redacted]w[redacted]d  1. Supervision of high risk pregnancy, antepartum (Primary)  2. Gestational diabetes mellitus (GDM) affecting pregnancy - Good glucose control:  FBS:  80-90   and   2 Hour PP:  100-110  3. Pruritus of pregnancy, second trimester  4. Anemia  affecting pregnancy, antepartum - taking Accrufer   5. Choroid plexus cyst of fetus in singleton pregnancy  6. Rubella non-immune status, antepartum - vaccination postpartum    Preterm labor symptoms and general obstetric precautions including but not limited to vaginal bleeding, contractions, leaking of fluid and fetal movement were reviewed in detail with the patient. Please refer to After Visit Summary for other counseling recommendations.   Return in about 1 week (around 07/21/2024) for [facility].   Rudy Carlin LABOR, MD 07/14/2024

## 2024-07-14 NOTE — Addendum Note (Signed)
 Addended by: ELAINE ROSINA SAILOR on: 07/14/2024 05:02 PM   Modules accepted: Orders

## 2024-07-15 ENCOUNTER — Ambulatory Visit: Payer: MEDICAID

## 2024-07-15 VITALS — BP 103/65 | HR 77 | Temp 97.5°F | Resp 16 | Ht 59.0 in | Wt 132.6 lb

## 2024-07-15 DIAGNOSIS — Z3A35 35 weeks gestation of pregnancy: Secondary | ICD-10-CM

## 2024-07-15 DIAGNOSIS — O99019 Anemia complicating pregnancy, unspecified trimester: Secondary | ICD-10-CM

## 2024-07-15 MED ORDER — IRON SUCROSE 200 MG IVPB - SIMPLE MED
200.0000 mg | Freq: Once | Status: AC
  Administered 2024-07-15: 200 mg via INTRAVENOUS
  Filled 2024-07-15: qty 110

## 2024-07-15 NOTE — Progress Notes (Signed)
 Diagnosis: Iron  Deficiency Anemia  Provider:  Praveen Mannam MD  Procedure: IV Infusion  IV Type: Peripheral, IV Location: R Antecubital   Venofer  (Iron  Sucrose), Dose: 200 mg  Infusion Start Time: 1246  Infusion Stop Time: 1302  Post Infusion IV Care: Patient declined observation  Discharge: Condition: Good, Destination: Home . AVS Declined  Performed by:  Leita FORBES Miles, LPN

## 2024-07-16 ENCOUNTER — Encounter (HOSPITAL_COMMUNITY): Payer: Self-pay | Admitting: *Deleted

## 2024-07-16 ENCOUNTER — Telehealth (HOSPITAL_COMMUNITY): Payer: Self-pay | Admitting: *Deleted

## 2024-07-16 LAB — CERVICOVAGINAL ANCILLARY ONLY
Chlamydia: NEGATIVE
Comment: NEGATIVE
Comment: NORMAL
Neisseria Gonorrhea: NEGATIVE

## 2024-07-16 NOTE — Telephone Encounter (Signed)
 Preadmission screen

## 2024-07-17 ENCOUNTER — Ambulatory Visit (INDEPENDENT_AMBULATORY_CARE_PROVIDER_SITE_OTHER): Payer: MEDICAID

## 2024-07-17 VITALS — BP 104/68 | HR 81 | Temp 97.7°F | Resp 18 | Ht 59.0 in | Wt 135.0 lb

## 2024-07-17 DIAGNOSIS — O99019 Anemia complicating pregnancy, unspecified trimester: Secondary | ICD-10-CM

## 2024-07-17 DIAGNOSIS — Z3A36 36 weeks gestation of pregnancy: Secondary | ICD-10-CM | POA: Diagnosis not present

## 2024-07-17 MED ORDER — IRON SUCROSE 200 MG IVPB - SIMPLE MED
200.0000 mg | Freq: Once | Status: AC
  Administered 2024-07-17: 200 mg via INTRAVENOUS
  Filled 2024-07-17: qty 110

## 2024-07-17 NOTE — Progress Notes (Signed)
 Diagnosis: Iron  Deficiency Anemia  Provider:  Praveen Mannam MD  Procedure: IV Infusion  IV Type: Peripheral, IV Location: L Forearm  Venofer  (Iron  Sucrose), Dose: 200 mg  Infusion Start Time: 1202  Infusion Stop Time: 1220  Post Infusion IV Care: Patient declined observation and Peripheral IV Discontinued  Discharge: Condition: Good, Destination: Home . AVS Declined  Performed by:  Davy Faught, RN

## 2024-07-19 LAB — CULTURE, BETA STREP (GROUP B ONLY): Strep Gp B Culture: NEGATIVE

## 2024-07-21 ENCOUNTER — Other Ambulatory Visit: Payer: MEDICAID

## 2024-07-21 ENCOUNTER — Encounter: Payer: MEDICAID | Admitting: Obstetrics and Gynecology

## 2024-07-22 ENCOUNTER — Encounter: Payer: Self-pay | Admitting: Obstetrics & Gynecology

## 2024-07-22 ENCOUNTER — Ambulatory Visit: Payer: MEDICAID

## 2024-07-24 ENCOUNTER — Inpatient Hospital Stay (HOSPITAL_COMMUNITY)
Admission: RE | Admit: 2024-07-24 | Discharge: 2024-07-27 | DRG: 807 | Disposition: A | Payer: MEDICAID | Attending: Obstetrics & Gynecology | Admitting: Obstetrics & Gynecology

## 2024-07-24 ENCOUNTER — Other Ambulatory Visit: Payer: Self-pay

## 2024-07-24 ENCOUNTER — Encounter (HOSPITAL_COMMUNITY): Payer: Self-pay | Admitting: Family Medicine

## 2024-07-24 ENCOUNTER — Inpatient Hospital Stay (HOSPITAL_COMMUNITY): Payer: MEDICAID

## 2024-07-24 DIAGNOSIS — O0993 Supervision of high risk pregnancy, unspecified, third trimester: Secondary | ICD-10-CM

## 2024-07-24 DIAGNOSIS — O0990 Supervision of high risk pregnancy, unspecified, unspecified trimester: Secondary | ICD-10-CM

## 2024-07-24 DIAGNOSIS — O9902 Anemia complicating childbirth: Secondary | ICD-10-CM | POA: Diagnosis present

## 2024-07-24 DIAGNOSIS — Z3A37 37 weeks gestation of pregnancy: Secondary | ICD-10-CM

## 2024-07-24 DIAGNOSIS — O26649 Intrahepatic cholestasis of pregnancy, unspecified trimester: Secondary | ICD-10-CM | POA: Diagnosis present

## 2024-07-24 DIAGNOSIS — O24420 Gestational diabetes mellitus in childbirth, diet controlled: Secondary | ICD-10-CM | POA: Diagnosis present

## 2024-07-24 DIAGNOSIS — O24419 Gestational diabetes mellitus in pregnancy, unspecified control: Secondary | ICD-10-CM | POA: Diagnosis present

## 2024-07-24 DIAGNOSIS — O26643 Intrahepatic cholestasis of pregnancy, third trimester: Principal | ICD-10-CM | POA: Diagnosis present

## 2024-07-24 DIAGNOSIS — Z2839 Other underimmunization status: Secondary | ICD-10-CM

## 2024-07-24 DIAGNOSIS — Z3490 Encounter for supervision of normal pregnancy, unspecified, unspecified trimester: Secondary | ICD-10-CM

## 2024-07-24 DIAGNOSIS — Z833 Family history of diabetes mellitus: Secondary | ICD-10-CM

## 2024-07-24 DIAGNOSIS — O99019 Anemia complicating pregnancy, unspecified trimester: Principal | ICD-10-CM | POA: Diagnosis present

## 2024-07-24 DIAGNOSIS — Z3480 Encounter for supervision of other normal pregnancy, unspecified trimester: Secondary | ICD-10-CM

## 2024-07-24 LAB — CBC
HCT: 29.5 % — ABNORMAL LOW (ref 36.0–46.0)
Hemoglobin: 10.1 g/dL — ABNORMAL LOW (ref 12.0–15.0)
MCH: 30.9 pg (ref 26.0–34.0)
MCHC: 34.2 g/dL (ref 30.0–36.0)
MCV: 90.2 fL (ref 80.0–100.0)
Platelets: 238 10*3/uL (ref 150–400)
RBC: 3.27 MIL/uL — ABNORMAL LOW (ref 3.87–5.11)
RDW: 19.1 % — ABNORMAL HIGH (ref 11.5–15.5)
WBC: 9.9 10*3/uL (ref 4.0–10.5)
nRBC: 0.2 % (ref 0.0–0.2)

## 2024-07-24 LAB — COMPREHENSIVE METABOLIC PANEL WITH GFR
ALT: 13 U/L (ref 0–44)
AST: 18 U/L (ref 15–41)
Albumin: 3.6 g/dL (ref 3.5–5.0)
Alkaline Phosphatase: 200 U/L — ABNORMAL HIGH (ref 38–126)
Anion gap: 13 (ref 5–15)
BUN: 10 mg/dL (ref 6–20)
CO2: 20 mmol/L — ABNORMAL LOW (ref 22–32)
Calcium: 8.9 mg/dL (ref 8.9–10.3)
Chloride: 101 mmol/L (ref 98–111)
Creatinine, Ser: 0.42 mg/dL — ABNORMAL LOW (ref 0.44–1.00)
GFR, Estimated: >60 mL/min
Glucose, Bld: 107 mg/dL — ABNORMAL HIGH (ref 70–99)
Potassium: 3.4 mmol/L — ABNORMAL LOW (ref 3.5–5.1)
Sodium: 134 mmol/L — ABNORMAL LOW (ref 135–145)
Total Bilirubin: 0.3 mg/dL (ref 0.0–1.2)
Total Protein: 6.4 g/dL — ABNORMAL LOW (ref 6.5–8.1)

## 2024-07-24 LAB — TYPE AND SCREEN
ABO/RH(D): O POS
Antibody Screen: NEGATIVE

## 2024-07-24 LAB — GLUCOSE, CAPILLARY
Glucose-Capillary: 91 mg/dL (ref 70–99)
Glucose-Capillary: 116 mg/dL — ABNORMAL HIGH (ref 70–99)
Glucose-Capillary: 106 mg/dL — ABNORMAL HIGH (ref 70–99)
Glucose-Capillary: 82 mg/dL (ref 70–99)
Glucose-Capillary: 102 mg/dL — ABNORMAL HIGH (ref 70–99)

## 2024-07-24 LAB — SYPHILIS: RPR W/REFLEX TO RPR TITER AND TREPONEMAL ANTIBODIES, TRADITIONAL SCREENING AND DIAGNOSIS ALGORITHM: RPR Ser Ql: NONREACTIVE

## 2024-07-24 MED ORDER — LACTATED RINGERS IV SOLN: 500.0000 mL | INTRAVENOUS | Status: AC | PRN

## 2024-07-24 MED ORDER — HYDROXYZINE HCL 25 MG PO TABS: 25.0000 mg | ORAL_TABLET | Freq: Three times a day (TID) | ORAL | Status: DC | PRN

## 2024-07-24 MED ORDER — TERBUTALINE SULFATE 1 MG/ML IJ SOLN: 0.2500 mg | Freq: Once | INTRAMUSCULAR | Status: DC | PRN

## 2024-07-24 MED ORDER — LACTATED RINGERS IV SOLN: 500.0000 mL | Freq: Once | INTRAVENOUS | Status: DC

## 2024-07-24 MED ORDER — INSULIN ASPART 100 UNIT/ML IJ SOLN: 0.0000 [IU] | INTRAMUSCULAR | Status: DC

## 2024-07-24 MED ORDER — DIPHENHYDRAMINE HCL 50 MG/ML IJ SOLN: 12.5000 mg | INTRAMUSCULAR | Status: AC | PRN

## 2024-07-24 MED ORDER — EPHEDRINE 5 MG/ML INJ: 10.0000 mg | INTRAVENOUS | Status: DC | PRN

## 2024-07-24 MED ORDER — FENTANYL CITRATE (PF) 100 MCG/2ML IJ SOLN
50.0000 ug | INTRAMUSCULAR | Status: DC | PRN
  Administered 2024-07-24: 50 ug via INTRAVENOUS
  Filled 2024-07-24 (×2): qty 2

## 2024-07-24 MED ORDER — OXYTOCIN-SODIUM CHLORIDE 30-0.9 UT/500ML-% IV SOLN: 1.0000 m[IU]/min | INTRAVENOUS | Status: DC

## 2024-07-24 MED ORDER — ACETAMINOPHEN 325 MG PO TABS
650.0000 mg | ORAL_TABLET | ORAL | Status: DC | PRN
  Administered 2024-07-25 (×2): 650 mg via ORAL
  Filled 2024-07-24 (×2): qty 2

## 2024-07-24 MED ORDER — OXYTOCIN BOLUS FROM INFUSION: 333.0000 mL | Freq: Once | INTRAVENOUS | Status: DC

## 2024-07-24 MED ORDER — OXYTOCIN-SODIUM CHLORIDE 30-0.9 UT/500ML-% IV SOLN
2.5000 [IU]/h | INTRAVENOUS | Status: DC
  Filled 2024-07-24: qty 500

## 2024-07-24 MED ORDER — ONDANSETRON HCL 4 MG/2ML IJ SOLN
4.0000 mg | Freq: Four times a day (QID) | INTRAMUSCULAR | Status: DC | PRN
  Administered 2024-07-25 (×3): 4 mg via INTRAVENOUS
  Filled 2024-07-24 (×3): qty 2

## 2024-07-24 MED ORDER — MISOPROSTOL 25 MCG QUARTER TABLET
25.0000 ug | ORAL_TABLET | Freq: Once | ORAL | Status: AC
  Administered 2024-07-24: 25 ug via VAGINAL
  Filled 2024-07-24: qty 1

## 2024-07-24 MED ORDER — MISOPROSTOL 50MCG HALF TABLET
50.0000 ug | ORAL_TABLET | Freq: Once | ORAL | Status: AC
  Administered 2024-07-24: 50 ug via ORAL
  Filled 2024-07-24: qty 1

## 2024-07-24 MED ORDER — SOD CITRATE-CITRIC ACID 500-334 MG/5ML PO SOLN: 30.0000 mL | ORAL | Status: DC | PRN

## 2024-07-24 MED ORDER — PHENYLEPHRINE 80 MCG/ML (10ML) SYRINGE FOR IV PUSH (FOR BLOOD PRESSURE SUPPORT): 80.0000 ug | PREFILLED_SYRINGE | INTRAVENOUS | Status: AC | PRN

## 2024-07-24 MED ORDER — LIDOCAINE HCL (PF) 1 % IJ SOLN: 30.0000 mL | INTRAMUSCULAR | Status: DC | PRN

## 2024-07-24 MED ORDER — LACTATED RINGERS IV SOLN: INTRAVENOUS | Status: AC

## 2024-07-24 MED ORDER — FENTANYL-BUPIVACAINE-NACL 0.5-0.125-0.9 MG/250ML-% EP SOLN
12.0000 mL/h | EPIDURAL | Status: AC | PRN
  Administered 2024-07-25 (×2): 12 mL/h via EPIDURAL
  Filled 2024-07-24 (×2): qty 250

## 2024-07-24 MED ORDER — ZOLPIDEM TARTRATE 5 MG PO TABS: 5.0000 mg | ORAL_TABLET | Freq: Every evening | ORAL | Status: DC | PRN

## 2024-07-24 NOTE — Inpatient Diabetes Management (Signed)
 Inpatient Diabetes Program Recommendations  ADA Standards of Care  Diabetes in Pregnancy Target Glucose Ranges:  Fasting: 70 - 95 mg/dL 1 hr postprandial:  889 - 140mg /dL (from first bite of meal) 2 hr postprandial:  100 - 120 mg/dL (from first bit of meal)    Lab Results  Component Value Date   GLUCAP 116 (H) 07/24/2024   HGBA1C 5.2 01/30/2024    Latest Reference Range & Units 07/24/24 03:35 07/24/24 07:41  Glucose-Capillary 70 - 99 mg/dL 91 883 (H)  (H): Data is abnormally high  Diabetes history: GD Outpatient Diabetes medications: None Current orders for Inpatient glycemic control: Novolog  0-14 units q 4 hrs.  Inpatient Diabetes Program Recommendations:   Reviewed patient notes. Agree with orders. Will follow during hospitalization.  Thank you, Dian Laprade E. Aarion Kittrell, RN, MSN, CNS, CDCES  Diabetes Coordinator Inpatient Glycemic Control Team Team Pager [phone] (8am-5pm) 07/24/2024 9:10 AM

## 2024-07-24 NOTE — H&P (Signed)
 Obstetric History and Physical  Allison Trevino is a 19 y.o. G2P0010 with IUP at [redacted]w[redacted]d presenting for induction of labor for cholestasis and A1GDM.  She had whole body pruritus, was diagnosed with cholestasis clinically and treated with Ursodiol  and Atarax . Patient states she has been having  irregular contractions, no vaginal bleeding, intact membranes, with active fetal movement.    Prenatal Course Source of Care: [facility]  with onset of care at 7 weeks Pregnancy complications or risks: Patient Active Problem List   Diagnosis Date Noted   Cholestasis during pregnancy 07/24/2024   Anemia affecting pregnancy, antepartum 07/01/2024   Gestational diabetes mellitus (GDM) affecting pregnancy 05/27/2024   Anemia 04/24/2024   Choroid plexus cyst of fetus in singleton pregnancy 03/24/2024   Rubella non-immune status, antepartum 01/31/2024   Supervision of high-risk pregnancy 01/15/2024   Office Location [facility] Dating by LMP c/w US  at [redacted]w[redacted]d  [facility] Model Traditional Anatomy U/S Bilateral choroid plexus cysts  Initiated care at  [facility]  English              LAB RESULTS   Support Person  [facility] NIPS: LR AFP: neg    Carrier Screen Horizon: neg  Rhogam  O/Positive/-- (08/06 1009) A1C/GTT Early HgbA1C: 5.2 Third trimester 2 hr GTT: abnormal  Flu Vaccine  04/23/24    TDaP Vaccine  05/21/24 Blood Type O/Positive/-- (08/06 1009)  RSV Vaccine 06-30-24 Antibody Negative (08/06 1009)  COVID Vaccine  Rubella <0.90 (08/06 1009)  Feeding Plan breast RPR Non Reactive (08/06 1009)  Contraception oral progesterone-only contraceptive HBsAg Negative (08/06 1009)  Circumcision Yes if female HIV Non Reactive (08/06 1009)  Pediatrician   [facility] HCVAb Non Reactive (08/06 1009)  Prenatal Classes     BTL Consent  Pap Too young  BTL Pre-payment  GC/CT Initial:  neg/neg 36wks:  neg/neg  VBAC Consent  GBS Negative/-- (01/20 9072)    Past Medical History:  Diagnosis Date    Abnormal involuntary movement 04/08/2019   Depression    History of suicidal ideation    IBS (irritable bowel syndrome)    Insomnia 04/08/2019   Migraines    Severe anxiety    followed by dr macarthur piety    Past Surgical History:  Procedure Laterality Date   CHOLECYSTECTOMY N/A 10/14/2023   Procedure: LAPAROSCOPIC CHOLECYSTECTOMY;  Surgeon: Belinda Cough, MD;  Location: [facility];  Service: General;  Laterality: N/A;   COLONOSCOPY N/A 10/11/2023   Procedure: COLONOSCOPY;  Surgeon: Dianna Specking, MD;  Location: [facility];  Service: Gastroenterology;  Laterality: N/A;   DILATION AND EVACUATION N/A 12/06/2022   Procedure: DILATATION AND EVACUATION;  Surgeon: Erik Kieth BROCKS, MD;  Location: [facility];  Service: Gynecology;  Laterality: N/A;    OB History  Gravida Para Term Preterm AB Living  2 0 0 0 1 0  SAB IAB Ectopic Multiple Live Births  1 0 0 0 0    # Outcome Date GA Lbr Len/2nd Weight Sex Type Anes PTL Lv  2 Current           1 SAB 12/06/22 [redacted]w[redacted]d           Social History   Socioeconomic History   Marital status: Single    Spouse name: Not on file   Number of children: Not on file   Years of education: Not on file   Highest education level: 12th grade  Occupational History   Not on file  Tobacco Use   Smoking status: Never    Passive exposure: Current   Smokeless tobacco: Never  Vaping Use   Vaping status: Never Used  Substance and Sexual Activity   Alcohol use: Never   Drug use: Not Currently    Types: Marijuana    Comment: pos drug screen. Has not used since couple of weeks   Sexual activity: Yes  Other Topics Concern   Not on file  Social History Narrative   Allison Trevino graduated from [employer] May 2024   She lives with both parents, four siblings,  she is the youngest   Social Drivers of Health   Tobacco Use: Medium Risk (07/24/2024)   Patient History    Smoking Tobacco Use: Never    Smokeless Tobacco Use: Never     Passive Exposure: Current  Financial Resource Strain: Not on file  Food Insecurity: No Food Insecurity (07/24/2024)   Epic    Worried About Programme Researcher, Broadcasting/film/video in the Last Year: Never true    Ran Out of Food in the Last Year: Never true  Transportation Needs: No Transportation Needs (07/24/2024)   Epic    Lack of Transportation (Medical): No    Lack of Transportation (Non-Medical): No  Physical Activity: Not on file  Stress: Not on file  Social Connections: Socially Isolated (07/24/2024)   Social Connection and Isolation Panel    Frequency of Communication with Friends and Family: Three times a week    Frequency of Social Gatherings with Friends and Family: Three times a week    Attends Religious Services: Never    Active Member of Clubs or Organizations: No    Attends Banker Meetings: Never    Marital Status: Never married  Depression (PHQ2-9): Low Risk (05/21/2024)   Depression (PHQ2-9)    PHQ-2 Score: 1  Alcohol Screen: Not on file  Housing: Low Risk (07/24/2024)   Epic    Unable to Pay for Housing in the Last Year: No    Number of Times Moved in the Last Year: 0    Homeless in the Last Year: No  Utilities: Not At Risk (07/24/2024)   Epic    Threatened with loss of utilities: No  Health Literacy: Not on file    Family History  Problem Relation Age of Onset   Diabetes Mother    Healthy Father    Hypertension Neg Hx    Cancer Neg Hx     Medications Prior to Admission  Medication Sig Dispense Refill Last Dose/Taking   aspirin  EC 81 MG tablet Take 1 tablet (81 mg total) by mouth daily. Start taking when you are [redacted] weeks pregnant for rest of pregnancy for prevention of preeclampsia 300 tablet 2 Past Week Evening   Prenatal Vit-Fe Fumarate-FA (PRENATAL VITAMIN PLUS LOW IRON  PO) Take 1 tablet by mouth daily.   07/23/2024 Evening   Accu-Chek Softclix Lancets lancets 4 times daily 100 each 12    acetaminophen  (TYLENOL ) 500 MG tablet Take 2 tablets (1,000 mg total) by  mouth every 6 (six) hours as needed for mild pain (pain score 1-3).      Blood Glucose Monitoring Suppl (ACCU-CHEK GUIDE) w/Device KIT 1 Device by Does not apply route 4 (four) times daily. Use 4 times daily as instructed 1 kit 0    cyclobenzaprine  (FLEXERIL ) 10 MG tablet Take 1 tablet (10 mg total) by mouth every 8 (eight) hours as needed for muscle spasms. 30  tablet 1    diphenhydrAMINE  (BENADRYL ) 50 MG tablet Take 0.5 tablets (25 mg total) by mouth every 8 (eight) hours as needed for itching. (Patient not taking: Reported on 06/30/2024) 10 tablet 0    famotidine  (PEPCID ) 20 MG tablet Take 1 tablet (20 mg total) by mouth 2 (two) times daily. 60 tablet 2    Ferric Maltol  30 MG CAPS Take 1 capsule (30 mg total) by mouth 2 (two) times daily. Please take one hour before breakfast and dinner 60 capsule 2    ferrous sulfate  325 (65 FE) MG tablet Take 1 tablet (325 mg total) by mouth every other day. 90 tablet 1    glucose blood (ACCU-CHEK GUIDE TEST) test strip 4 times daily 100 each 12    ondansetron  (ZOFRAN -ODT) 4 MG disintegrating tablet Take 1 tablet (4 mg total) by mouth every 6 (six) hours as needed for nausea. 20 tablet 0    propranolol  (INDERAL ) 10 MG tablet Take 10 mg by mouth daily as needed (anxiety). (Patient not taking: Reported on 06/30/2024)      propranolol  (INDERAL ) 10 MG tablet Take 1 tablet (10 mg total) by mouth 3 (three) times daily as needed. (Patient not taking: Reported on 06/30/2024) 30 tablet 1    simethicone  (MYLICON) 80 MG chewable tablet Chew 1 tablet (80 mg total) by mouth every 6 (six) hours as needed. 30 tablet 0    ursodiol  (ACTIGALL ) 300 MG capsule Take 1 capsule (300 mg total) by mouth 2 (two) times daily. 60 capsule 3     Allergies[1]  Review of Systems: Negative except for what is mentioned in HPI.  Physical Exam: Ht 4' 11 (1.499 m)   Wt 61.1 kg   LMP 11/08/2023 (Exact Date)   BMI 27.21 kg/m  CONSTITUTIONAL: Well-developed, well-nourished female in no acute  distress.  HENT:  Normocephalic, atraumatic, External right and left ear normal. Oropharynx is clear and moist EYES: Conjunctivae and EOM are normal. Pupils are equal, round, and reactive to light. No scleral icterus.  NECK: Normal range of motion, supple, no masses SKIN: Skin is warm and dry. No rash noted. Not diaphoretic. No erythema. No pallor. NEUROLOGIC: Alert and oriented to person, place, and time. Normal reflexes, muscle tone coordination. No cranial nerve deficit noted. PSYCHIATRIC: Normal mood and affect. Normal behavior. Normal judgment and thought content. CARDIOVASCULAR: Normal heart rate noted, regular rhythm RESPIRATORY: Effort and breath sounds normal, no problems with respiration noted ABDOMEN: Soft, nontender, nondistended, gravid. MUSCULOSKELETAL: Normal range of motion. No edema and no tenderness. 2+ distal pulses.  Cervical Exam: Dilation: Fingertip Effacement (%): Thick Station: -3 Presentation: Vertex Exam by:: Alan Sandifer, RN Presentation: cephalic, verified on ultrasound FHT:  Baseline rate 130 bpm   Variability moderate  Accelerations present   Decelerations none Contractions: Every 2-5 mins   Pertinent Labs/Studies:   No results found for this or any previous visit (from the past 24 hours).  Assessment : Collene Massimino is a 19 y.o. G2P0010 at [redacted]w[redacted]d being admitted for induction of labor due to cholestasis and A1GDM.  Plan: Labor:  Cervical ripening and induction ordered as per protocol. Analgesia as needed. Cholestasis: Will continue Atarax  as needed A1GDM: CBGs q4h and manage accordingly FWB: Reassuring fetal heart tracing.  GBS negative Delivery plan: Hopeful for vaginal delivery   GLORIS HUGGER, MD, FACOG Obstetrician & Gynecologist, [facility] for [facility], [facility]        [1]  Allergies Allergen Reactions   Hydroxyzine  Other (  See Comments)    Suicidal Ideation, Hallucinations    Azithromycin  Itching and Nausea And Vomiting    Severe N/V and severe itching   Lactose Intolerance (Gi) Other (See Comments)    GI Upset   Metronidazole  Itching   Hydromorphone  Rash    Possible allergy to opiates including oxycodone  / dilaudid  / Fentanyl . Received opiate medications (dilaudid , fentanyl ) as well as Augmentin  on 4/15 developed rash. On 4/16 received Oxycodone  and Ceftriaxone  around same time + developed recurrent rash / itching.

## 2024-07-24 NOTE — Progress Notes (Signed)
 Kandyce Dieguez is a 19 y.o. G2P0010 at [redacted]w[redacted]d admitted for IOL for cholestasis and AIGDM  Subjective: Patient is sleeping comfortably.  Objective: BP 119/76   Pulse 96   Temp 97.6 F (36.4 C) (Axillary)   Resp 16   Ht 4' 11 (1.499 m)   Wt 61.1 kg   LMP 11/08/2023 (Exact Date)   BMI 27.21 kg/m   FHT:  FHR: 135 bpm, variability: moderate,  accelerations:  Present,  decelerations:  Absent UC:   irregular, every 2-5 minutes SVE:   Dilation: Fingertip Effacement (%): Thick Station: -3 Exam by:: Alan Sandifer, RN at 0430  Labs: Lab Results  Component Value Date   WBC 9.9 07/24/2024   HGB 10.1 (L) 07/24/2024   HCT 29.5 (L) 07/24/2024   MCV 90.2 07/24/2024   PLT 238 07/24/2024   CMP     Component Value Date/Time   NA 134 (L) 07/24/2024 0415   NA 136 06/04/2024 1552   K 3.4 (L) 07/24/2024 0415   CL 101 07/24/2024 0415   CO2 20 (L) 07/24/2024 0415   GLUCOSE 107 (H) 07/24/2024 0415   BUN 10 07/24/2024 0415   BUN 10 06/04/2024 1552   CREATININE 0.42 (L) 07/24/2024 0415   CALCIUM 8.9 07/24/2024 0415   PROT 6.4 (L) 07/24/2024 0415   PROT 6.0 06/04/2024 1552   ALBUMIN 3.6 07/24/2024 0415   ALBUMIN 3.5 (L) 06/04/2024 1552   AST 18 07/24/2024 0415   ALT 13 07/24/2024 0415   ALKPHOS 200 (H) 07/24/2024 0415   BILITOT 0.3 07/24/2024 0415   BILITOT <0.2 06/04/2024 1552   EGFR 147 06/04/2024 1552   GFRNONAA >60 07/24/2024 0415    Assessment / Plan: Induction of labor due to cholestasis and A1GDM  Labor: Dual Cytotec  administered around 0430, will reevaluate at 0830 A1GDM:  Continue Q4H checks  Cholestasis: Continue Atarax  prn Fetal Wellbeing:  Category I Pain Control:  Ordered as needed I/D:  GBS neg Anticipated MOD:  NSVD  Gloris Hugger, MD 07/24/2024, 7:31 AM

## 2024-07-24 NOTE — Progress Notes (Addendum)
 Alyissa Whidbee is a 19 y.o. G2P0010 at [redacted]w[redacted]d by admitted for induction of labor due to cholestasis and A1GDM.  Subjective: Patient is comfortable  Objective: BP 108/61   Pulse 82   Temp 98.3 F (36.8 C)   Resp 16   Ht 4' 11 (1.499 m)   Wt 61.1 kg   LMP 11/08/2023 (Exact Date)   BMI 27.21 kg/m   FHT:  FHR: 145 bpm, variability: moderate,  accelerations:  Present,  decelerations:  Absent UC:   regular, every 1-3 minutes SVE:   Dilation: 1 Effacement (%): 50 Station: -2 Exam by:: Dr. Herchel (Inserted foley bulb, balloon inflated with 50 ml of fluid) Exam done with chaperone present: Ike Misty, RN  Labs: Lab Results  Component Value Date   WBC 9.9 07/24/2024   HGB 10.1 (L) 07/24/2024   HCT 29.5 (L) 07/24/2024   MCV 90.2 07/24/2024   PLT 238 07/24/2024   CBG (last 3)  Recent Labs    07/24/24 0741 07/24/24 1138 07/24/24 1612  GLUCAP 116* 106* 82    Assessment / Plan: Induction of labor due to cholestasis and A1GDM, has received misoprostol , now s/o FB placement  Labor: Continue cervical ripening. Will start pitocin  or AROM once foley bulb is out Cholestasis:  On Atarax  prn A1GDM: Stable CBGs, continue q4h checks.  On SSI for coverage. Fetal Wellbeing:  Category I Pain Control:  IV pain meds I/D:  GBS neg Anticipated MOD:  NSVD   Sherrel Shafer, MD 07/24/2024, 8:05 PM

## 2024-07-24 NOTE — Progress Notes (Signed)
 Labor Progress Note Allison Trevino is a 19 y.o. G2P0010 at [redacted]w[redacted]d presented for IOL for cholestasis and AIGDM  S: Not feeling contractions much.   O:  BP (!) 101/50   Pulse 81   Temp 97.6 F (36.4 C) (Axillary)   Resp 16   Ht 4' 11 (1.499 m)   Wt 61.1 kg   LMP 11/08/2023 (Exact Date)   BMI 27.21 kg/m  EFM: 140bpm/good variability/accels present/ decels absent  CVE: Dilation: 1 Effacement (%): 30 Station: -3 Presentation: Vertex Exam by:: Dr. Jomarie   A&P: 19 y.o. G2P0010 [redacted]w[redacted]d for IOL for cholestasis and AIGDM  #Labor: Progressing well.  S/p dual cytotec . Attempt at [facility] placement unsuccessful.  #Pain: IV pain meds available #FWB: Category I tracing #GBS negative  #A1GDM - Q4H blood glucose checks  #Cholestasis - Continue atarax  PRN  Garen SHAUNNA Puffer, MD 11:55 AM

## 2024-07-25 ENCOUNTER — Encounter (HOSPITAL_COMMUNITY): Payer: Self-pay | Admitting: Obstetrics & Gynecology

## 2024-07-25 ENCOUNTER — Inpatient Hospital Stay (HOSPITAL_COMMUNITY): Payer: MEDICAID | Admitting: Anesthesiology

## 2024-07-25 LAB — GLUCOSE, CAPILLARY
Glucose-Capillary: 76 mg/dL (ref 70–99)
Glucose-Capillary: 82 mg/dL (ref 70–99)
Glucose-Capillary: 91 mg/dL (ref 70–99)
Glucose-Capillary: 94 mg/dL (ref 70–99)
Glucose-Capillary: 95 mg/dL (ref 70–99)

## 2024-07-25 MED ORDER — FENTANYL CITRATE (PF) 100 MCG/2ML IJ SOLN
INTRAMUSCULAR | Status: DC | PRN
  Administered 2024-07-25: 100 ug via EPIDURAL

## 2024-07-25 MED ORDER — FENTANYL CITRATE (PF) 100 MCG/2ML IJ SOLN
INTRAMUSCULAR | Status: AC
  Filled 2024-07-25: qty 2

## 2024-07-25 MED ORDER — BUPIVACAINE HCL (PF) 0.25 % IJ SOLN
INTRAMUSCULAR | Status: DC | PRN
  Administered 2024-07-25: 8 mL via EPIDURAL

## 2024-07-25 MED ORDER — LIDOCAINE HCL (PF) 1 % IJ SOLN
INTRAMUSCULAR | Status: DC | PRN
  Administered 2024-07-25: 5 mL via EPIDURAL
  Administered 2024-07-25 (×2): 4 mL via EPIDURAL

## 2024-07-25 MED ORDER — LIDOCAINE-EPINEPHRINE (PF) 1.5 %-1:200000 IJ SOLN
INTRAMUSCULAR | Status: DC | PRN
  Administered 2024-07-25: 5 mL via EPIDURAL

## 2024-07-25 MED ORDER — LACTATED RINGERS IV SOLN: INTRAVENOUS | Status: DC

## 2024-07-25 MED ORDER — TRANEXAMIC ACID-NACL 1000-0.7 MG/100ML-% IV SOLN
1000.0000 mg | INTRAVENOUS | Status: AC
  Administered 2024-07-25: 1000 mg via INTRAVENOUS

## 2024-07-25 MED ORDER — TERBUTALINE SULFATE 1 MG/ML IJ SOLN: 0.2500 mg | Freq: Once | INTRAMUSCULAR | Status: DC | PRN

## 2024-07-25 MED ORDER — OXYTOCIN-SODIUM CHLORIDE 30-0.9 UT/500ML-% IV SOLN
1.0000 m[IU]/min | INTRAVENOUS | Status: DC
  Administered 2024-07-25: 2 m[IU]/min via INTRAVENOUS
  Filled 2024-07-25: qty 500

## 2024-07-25 MED ORDER — LACTATED RINGERS IV SOLN
500.0000 mL | INTRAVENOUS | Status: DC | PRN
  Administered 2024-07-25 (×2): 500 mL via INTRAVENOUS

## 2024-07-25 MED ORDER — METHYLERGONOVINE MALEATE 0.2 MG/ML IJ SOLN
INTRAMUSCULAR | Status: AC
  Administered 2024-07-25: 0.2 mg via INTRAMUSCULAR
  Filled 2024-07-25: qty 1

## 2024-07-25 MED ORDER — TRANEXAMIC ACID-NACL 1000-0.7 MG/100ML-% IV SOLN
INTRAVENOUS | Status: AC
  Filled 2024-07-25: qty 100

## 2024-07-25 MED ORDER — METHYLERGONOVINE MALEATE 0.2 MG/ML IJ SOLN: 0.2000 mg | Freq: Once | INTRAMUSCULAR | Status: AC

## 2024-07-25 NOTE — Progress Notes (Signed)
 Patient Vitals for the past 4 hrs:  BP Temp Temp src Pulse Resp  07/25/24 0105 -- -- -- -- 18  07/25/24 0041 110/66 -- -- 94 --  07/24/24 2352 -- 98.2 F (36.8 C) Oral -- --  BLood sugars 102/95   Comfortable w/epidural  Balloon out.  Cx 3.5/50/-2.  AROM w/clear fluid.  If labor not adequate in an hour, will start pitocin 

## 2024-07-25 NOTE — Anesthesia Procedure Notes (Signed)
 Epidural Patient location during procedure: OB Start time: 07/25/2024 2:42 PM End time: 07/25/2024 2:45 PM  Staffing Anesthesiologist: Paul Lamarr BRAVO, MD Performed: anesthesiologist   Preanesthetic Checklist Completed: patient identified, IV checked, risks and benefits discussed, monitors and equipment checked, pre-op evaluation and timeout performed  Epidural Patient position: sitting Prep: DuraPrep and site prepped and draped Patient monitoring: continuous pulse ox, blood pressure and heart rate Approach: midline Location: L3-L4 Injection technique: LOR air  Needle:  Needle type: Tuohy  Needle gauge: 17 G Needle length: 9 cm Needle insertion depth: 6 cm Catheter type: closed end flexible Catheter size: 19 Gauge Catheter at skin depth: 11 cm Test dose: negative and Other (1% lidocaine )  Assessment Events: blood not aspirated, no cerebrospinal fluid, injection not painful, no injection resistance, no paresthesia and negative IV test  Additional Notes Patient identified. Risks, benefits, and alternatives discussed with patient including but not limited to bleeding, infection, nerve damage, paralysis, failed block, incomplete pain control, headache, blood pressure changes, nausea, vomiting, reactions to medication, itching, and postpartum back pain. Confirmed with bedside nurse the patient's most recent platelet count. Confirmed with patient that they are not currently taking any anticoagulation, have any bleeding history, or any family history of bleeding disorders. Patient expressed understanding and wished to proceed. All questions were answered. Sterile technique was used throughout the entire procedure. Please see nursing notes for vital signs.   Previous epidural removed with tip intact. New catheter placed one level above previous. Crisp LOR on first pass. Test dose was given through epidural catheter and negative prior to continuing to dose epidural or start infusion.  Warning signs of high block given to the patient including shortness of breath, tingling/numbness in hands, complete motor block, or any concerning symptoms with instructions to call for help. Patient was given instructions on fall risk and not to get out of bed. All questions and concerns addressed with instructions to call with any issues or inadequate analgesia.  Reason for block:procedure for pain

## 2024-07-25 NOTE — Progress Notes (Signed)
 Allison Trevino is a 19 y.o. G2P0010 at [redacted]w[redacted]d admitted for Cholestasis  Subjective: Patient is still uncomfortable despite epidural replacement. Requesting SVE.  Objective: BP 116/66   Pulse 89   Temp 98 F (36.7 C) (Oral)   Resp 17   Ht 4' 11 (1.499 m)   Wt 61.1 kg   LMP 11/08/2023 (Exact Date)   SpO2 100%   BMI 27.21 kg/m  No intake/output data recorded. Total I/O In: -  Out: 1725 [Urine:1725]  FHT:  FHR: 145 bpm, variability: moderate,  accelerations:  Present,  decelerations:  Present occasional variable UC:   regular, every 2-5 minutes SVE:   Dilation: 8.5 Effacement (%): 90 Station: +1 Exam by:: Giamarie Bueche CNM  Labs: Lab Results  Component Value Date   WBC 9.9 07/24/2024   HGB 10.1 (L) 07/24/2024   HCT 29.5 (L) 07/24/2024   MCV 90.2 07/24/2024   PLT 238 07/24/2024    Assessment / Plan: Induction of labor due to chorioamnioitis,  progressing well on pitocin   Labor: SVE reassuring, making cervical change. Offered to increase pitocin  as long as baby tolerates it. Patient declines for now. Expectant management. Preeclampsia:  N/A Fetal Wellbeing:  Category I Pain Control:  Epidural I/D:  N/A Anticipated MOD:  NSVD  Mitzie JINNY Molly, CNM 07/25/2024, 6:01 PM

## 2024-07-25 NOTE — Progress Notes (Signed)
 Patient requested cesarean section at this time, as well as requested to talk to provider. CNM notified, CNM at bedside to discuss patient request and educate patient on plan of care. Extensive discussion was had with entire care team including RN, CNM, fellow, and attending. The decision was made to continue on with the plan of care in motion.    Lum VEAR Ernst RN 07/25/2024 [phone]

## 2024-07-25 NOTE — Progress Notes (Signed)
 Allison Trevino is a 19 y.o. G2P0010 at [redacted]w[redacted]d.  Subjective: Patient is feeling uncomfortable contractions, and back pain -- breathing through. Requesting anesthesia follow up. She is accompanied by her mother.    Objective: BP 117/73   Pulse 75   Temp (!) 97 F (36.1 C) (Axillary)   Resp 18   Ht 4' 11 (1.499 m)   Wt 61.1 kg   LMP 11/08/2023 (Exact Date)   SpO2 99%   BMI 27.21 kg/m    FHT:  FHR: 2-3 bpm, variability: Moderate,  accelerations:  15x15,  decelerations:  None UC:   Q 1-31minutes, Regular Dilation: 4.5 Effacement (%): 60 Cervical Position: Posterior Station: -3 Presentation: Vertex Exam by:: [employer] RN  Labs: Results for orders placed or performed during the hospital encounter of 07/24/24 (from the past 24 hours)  Glucose, capillary     Status: Abnormal   Collection Time: 07/24/24 11:38 AM  Result Value Ref Range   Glucose-Capillary 106 (H) 70 - 99 mg/dL  Glucose, capillary     Status: None   Collection Time: 07/24/24  4:12 PM  Result Value Ref Range   Glucose-Capillary 82 70 - 99 mg/dL  Glucose, capillary     Status: Abnormal   Collection Time: 07/24/24  8:23 PM  Result Value Ref Range   Glucose-Capillary 102 (H) 70 - 99 mg/dL  Glucose, capillary     Status: None   Collection Time: 07/25/24 12:56 AM  Result Value Ref Range   Glucose-Capillary 95 70 - 99 mg/dL  Glucose, capillary     Status: None   Collection Time: 07/25/24  5:42 AM  Result Value Ref Range   Glucose-Capillary 94 70 - 99 mg/dL  Glucose, capillary     Status: None   Collection Time: 07/25/24  9:42 AM  Result Value Ref Range   Glucose-Capillary 76 70 - 99 mg/dL    Assessment / Plan: [redacted]w[redacted]d week IUP IOL for Cholestasis ROM x 8 Hours 11 Minutes Labor: Latent labor. Currently on 10mu of Pitocin . Plan to continue titrating Pitocin  to achieve adequate labor progress as tolerate by FHR tracing. Will perform cervical check in four hours from last exam, or sooner if clinically  indicated.  Fetal Wellbeing:  Category 1 Pain Control:  Epidural Anticipated MOD:  Vaginal  Heavan Francom L, Student-MidWife 07/25/2024 10:22 AM

## 2024-07-25 NOTE — Progress Notes (Signed)
 Patient ID: Allison Trevino, female   DOB: 2005/10/20, 20 y.o.   MRN: [id number] Allison Trevino is a 19 y.o. G2P0010 at [redacted]w[redacted]d.  Subjective: Patient visibly uncomfortable, breathing through contractions. SNM to bedside to offer SVE, patient deferred at this time, would like epidural replaced first.   Objective: BP 105/67   Pulse 79   Temp (!) 96.4 F (35.8 C) (Axillary)   Resp 17   Ht 4' 11 (1.499 m)   Wt 61.1 kg   LMP 11/08/2023 (Exact Date)   SpO2 97%   BMI 27.21 kg/m    FHT:  FHR: 155 bpm, variability: Mod,  accelerations:  15x15,  decelerations:  None UC:   Q 1 minutes Dilation: 5 Effacement (%): 80 Cervical Position: Posterior Station: -2 Presentation: Vertex Exam by:: [employer] RN  Labs: Results for orders placed or performed during the hospital encounter of 07/24/24 (from the past 24 hours)  Glucose, capillary     Status: None   Collection Time: 07/24/24  4:12 PM  Result Value Ref Range   Glucose-Capillary 82 70 - 99 mg/dL  Glucose, capillary     Status: Abnormal   Collection Time: 07/24/24  8:23 PM  Result Value Ref Range   Glucose-Capillary 102 (H) 70 - 99 mg/dL  Glucose, capillary     Status: None   Collection Time: 07/25/24 12:56 AM  Result Value Ref Range   Glucose-Capillary 95 70 - 99 mg/dL  Glucose, capillary     Status: None   Collection Time: 07/25/24  5:42 AM  Result Value Ref Range   Glucose-Capillary 94 70 - 99 mg/dL  Glucose, capillary     Status: None   Collection Time: 07/25/24  9:42 AM  Result Value Ref Range   Glucose-Capillary 76 70 - 99 mg/dL  Glucose, capillary     Status: None   Collection Time: 07/25/24  1:58 PM  Result Value Ref Range   Glucose-Capillary 82 70 - 99 mg/dL    Assessment / Plan: [redacted]w[redacted]d week IUP ROM x Hours: 11 Minutes: 50 Labor: Plan for patient to have epidural replaced. Patient appears to be progressing well, on Pitocin . Plan to perform cervical check once she becomes comfortable with her new  epidural or sooner if clinically indicated.   Fetal Wellbeing:  Category 1 Pain Control:  Epidural Anticipated MOD:  Vaginal  Malerie Eakins L, Student-MidWife 07/25/2024 2:01 PM

## 2024-07-25 NOTE — Discharge Summary (Signed)
 "    Postpartum Discharge Summary  Date of Service updated***     Patient Name: Allison Trevino DOB: 20-Apr-2006 MRN: [id number]  Date of admission: 07/24/2024 Delivery date:07/25/2024 Delivering provider: MAGALI BARKLEY CROME Date of discharge: 07/25/2024  Admitting diagnosis: Cholestasis during pregnancy [O26.649] Intrauterine pregnancy: [redacted]w[redacted]d     Secondary diagnosis:  Principal Problem:   Cholestasis during pregnancy Active Problems:   Supervision of high-risk pregnancy   Rubella non-immune status, antepartum   Gestational diabetes mellitus (GDM) affecting pregnancy   Anemia affecting pregnancy, antepartum  Additional problems: ***    Discharge diagnosis: {DX.:23714}                                              Post partum procedures:{Postpartum procedures:23558} Augmentation: AROM, Pitocin , Cytotec , and IP Foley Complications: None  Hospital course: Induction of Labor With Vaginal Delivery   19 y.o. yo G2P0010 at [redacted]w[redacted]d was admitted to the hospital 07/24/2024 for induction of labor.  Indication for induction: Cholestasis of pregnancy.  Patient had an uncomplicated labor course. Membrane Rupture Time/Date: 2:11 AM,07/25/2024  Delivery Method:Vaginal, Spontaneous Operative Delivery:N/A Episiotomy: None Lacerations:  1st degree;Perineal Details of delivery can be found in separate delivery note.  Patient had a postpartum course complicated by***. Patient is discharged home 07/25/24.  Newborn Data: Birth date:07/25/2024 Birth time:10:32 PM Gender:Female Living status:  Apgars: ,  Weight:   Magnesium Sulfate received: {Mag received:30440022} BMZ received: No Rhophylac:N/A MMR: Non-immune, offered PP T-DaP:Given prenatally Flu: Yes RSV Vaccine received: Yes Transfusion:{Transfusion received:30440034}  Immunizations received: Immunization History  Administered Date(s) Administered    sv, Bivalent, Protein Subunit Rsvpref,pf [place]low) 06/30/2024   Influenza,  Seasonal, Injecte, Preservative Fre 04/23/2024   Tdap 05/21/2024    Physical exam  Vitals:   07/25/24 1730 07/25/24 1800 07/25/24 1830 07/25/24 1900  BP: 116/66 (!) 106/53 (!) 106/52 112/61  Pulse: 89 78 79 91  Resp:  19    Temp:  98.7 F (37.1 C)    TempSrc:  Axillary    SpO2: 100% 100% 100% 100%  Weight:      Height:       General: {Exam; general:21111117} Lochia: {Desc; appropriate/inappropriate:30686::appropriate} Uterine Fundus: {Desc; firm/soft:30687} Incision: {Exam; incision:21111123} DVT Evaluation: {Exam; dvt:2111122} Labs: Lab Results  Component Value Date   WBC 9.9 07/24/2024   HGB 10.1 (L) 07/24/2024   HCT 29.5 (L) 07/24/2024   MCV 90.2 07/24/2024   PLT 238 07/24/2024      Latest Ref Rng & Units 07/24/2024    4:15 AM  CMP  Glucose 70 - 99 mg/dL 892   BUN 6 - 20 mg/dL 10   Creatinine 9.55 - 1.00 mg/dL 9.57   Sodium 864 - 854 mmol/L 134   Potassium 3.5 - 5.1 mmol/L 3.4   Chloride 98 - 111 mmol/L 101   CO2 22 - 32 mmol/L 20   Calcium 8.9 - 10.3 mg/dL 8.9   Total Protein 6.5 - 8.1 g/dL 6.4   Total Bilirubin 0.0 - 1.2 mg/dL 0.3   Alkaline Phos 38 - 126 U/L 200   AST 15 - 41 U/L 18   ALT 0 - 44 U/L 13    Edinburgh Score:     No data to display         No data recorded  After visit meds:  Allergies as of 07/25/2024  Reactions   Azithromycin  Itching, Nausea And Vomiting   Severe N/V and severe itching   Lactose Intolerance (gi) Other (See Comments)   GI Upset   Metronidazole  Itching   Hydromorphone  Rash   Possible allergy to opiates including oxycodone  / dilaudid  / Fentanyl . Received opiate medications (dilaudid , fentanyl ) as well as Augmentin  on 4/15 developed rash. On 4/16 received Oxycodone  and Ceftriaxone  around same time + developed recurrent rash / itching.   Hydroxyzine  Nausea Only   Reported reaction of suicidal Ideation, hallucinations in the past, but not with recent use (2026) - only had nausea     Med Rec must be completed  prior to using this [facility]***        Discharge home in stable condition Infant Feeding: {Baby feeding:23562} Infant Disposition:{CHL IP OB HOME WITH FNUYZM:76418} Discharge instruction: per After Visit Summary and Postpartum booklet. Activity: Advance as tolerated. Pelvic rest for 6 weeks.  Diet: {OB ipzu:[id number]} Future Appointments:No future appointments.   -Message sent to [facility] 1/30   07/25/2024 Kyshaun Barnette LITTIE Angles, MD    "

## 2024-07-25 NOTE — Anesthesia Preprocedure Evaluation (Signed)
"                                    Anesthesia Evaluation  Patient identified by MRN, date of birth, ID band Patient awake    Reviewed: Allergy & Precautions, H&P , NPO status , Patient's Chart, lab work & pertinent test results  History of Anesthesia Complications Negative for: history of anesthetic complications  Airway Mallampati: II       Dental no notable dental hx.    Pulmonary neg pulmonary ROS   Pulmonary exam normal        Cardiovascular negative cardio ROS Normal cardiovascular exam     Neuro/Psych  Headaches PSYCHIATRIC DISORDERS Anxiety Depression       GI/Hepatic Neg liver ROS,,,IBS (irritable bowel syndrome)    Endo/Other  diabetes    Renal/GU negative Renal ROS  negative genitourinary   Musculoskeletal   Abdominal   Peds  Hematology  (+) Blood dyscrasia, anemia   Anesthesia Other Findings   Reproductive/Obstetrics (+) Pregnancy                              Anesthesia Physical Anesthesia Plan  ASA: 2  Anesthesia Plan: Epidural   Post-op Pain Management:    Induction:   PONV Risk Score and Plan:   Airway Management Planned:   Additional Equipment:   Intra-op Plan:   Post-operative Plan:   Informed Consent: I have reviewed the patients History and Physical, chart, labs and discussed the procedure including the risks, benefits and alternatives for the proposed anesthesia with the patient or authorized representative who has indicated his/her understanding and acceptance.       Plan Discussed with:   Anesthesia Plan Comments:         Anesthesia Quick Evaluation  "

## 2024-07-25 NOTE — Anesthesia Procedure Notes (Signed)
 Epidural Patient location during procedure: OB Start time: 07/25/2024 12:31 AM End time: 07/25/2024 12:41 AM  Staffing Anesthesiologist: Patrisha Bernardino SQUIBB, MD Performed: anesthesiologist   Preanesthetic Checklist Completed: patient identified, IV checked, site marked, risks and benefits discussed, monitors and equipment checked, pre-op evaluation and timeout performed  Epidural Patient position: sitting Prep: DuraPrep Patient monitoring: heart rate, cardiac monitor, continuous pulse ox and blood pressure Approach: midline Location: L3-L4 Injection technique: LOR air  Needle:  Needle type: Tuohy  Needle gauge: 17 G Needle length: 9 cm Needle insertion depth: 5 cm Catheter type: closed end flexible Catheter size: 19 Gauge Catheter at skin depth: 10 cm Test dose: negative and 1.5% lidocaine  with Epi 1:200 K  Assessment Events: blood not aspirated, no cerebrospinal fluid, injection not painful, no injection resistance and negative IV test  Additional Notes Informed consent obtained prior to proceeding including risk of failure, 1% risk of PDPH, risk of minor discomfort and bruising. Discussed alternatives to epidural analgesia and patient desires to proceed.  Timeout performed pre-procedure verifying patient name, procedure, and platelet count.  Patient tolerated procedure well. Reason for block:procedure for pain

## 2024-07-26 ENCOUNTER — Other Ambulatory Visit (HOSPITAL_COMMUNITY): Payer: Self-pay

## 2024-07-26 MED ORDER — SIMETHICONE 80 MG PO CHEW: 80.0000 mg | CHEWABLE_TABLET | ORAL | Status: DC | PRN

## 2024-07-26 MED ORDER — ONDANSETRON HCL 4 MG/2ML IJ SOLN: 4.0000 mg | INTRAMUSCULAR | Status: DC | PRN

## 2024-07-26 MED ORDER — ACETAMINOPHEN 325 MG PO TABS: 650.0000 mg | ORAL_TABLET | ORAL | Status: DC | PRN

## 2024-07-26 MED ORDER — SODIUM CHLORIDE 0.9 % IV SOLN: 250.0000 mL | INTRAVENOUS | Status: DC | PRN

## 2024-07-26 MED ORDER — TETANUS-DIPHTH-ACELL PERTUSSIS 5-2-15.5 LF-MCG/0.5 IM SUSP: 0.5000 mL | Freq: Once | INTRAMUSCULAR | Status: DC

## 2024-07-26 MED ORDER — DIBUCAINE (PERIANAL) 1 % EX OINT: 1.0000 | TOPICAL_OINTMENT | CUTANEOUS | Status: DC | PRN

## 2024-07-26 MED ORDER — DIPHENHYDRAMINE HCL 25 MG PO CAPS: 25.0000 mg | ORAL_CAPSULE | Freq: Four times a day (QID) | ORAL | Status: DC | PRN

## 2024-07-26 MED ORDER — OXYCODONE HCL 5 MG PO TABS: 5.0000 mg | ORAL_TABLET | Freq: Four times a day (QID) | ORAL | Status: DC | PRN

## 2024-07-26 MED ORDER — SODIUM CHLORIDE 0.9% FLUSH: 3.0000 mL | INTRAVENOUS | Status: DC | PRN

## 2024-07-26 MED ORDER — COCONUT OIL OIL
1.0000 | TOPICAL_OIL | Status: DC | PRN
  Administered 2024-07-27: 1 via TOPICAL

## 2024-07-26 MED ORDER — BENZOCAINE-MENTHOL 20-0.5 % EX AERO
1.0000 | INHALATION_SPRAY | CUTANEOUS | Status: DC | PRN
  Filled 2024-07-26: qty 56

## 2024-07-26 MED ORDER — MEASLES, MUMPS & RUBELLA VAC ~~LOC~~ SUSR
0.5000 mL | Freq: Once | SUBCUTANEOUS | Status: AC
  Administered 2024-07-27: 0.5 mL via SUBCUTANEOUS
  Filled 2024-07-26: qty 0.5

## 2024-07-26 MED ORDER — PRENATAL MULTIVITAMIN CH
1.0000 | ORAL_TABLET | Freq: Every day | ORAL | Status: DC
  Filled 2024-07-26: qty 1

## 2024-07-26 MED ORDER — SENNOSIDES-DOCUSATE SODIUM 8.6-50 MG PO TABS
2.0000 | ORAL_TABLET | ORAL | Status: DC
  Administered 2024-07-26: 2 via ORAL
  Filled 2024-07-26 (×2): qty 2

## 2024-07-26 MED ORDER — WITCH HAZEL-GLYCERIN EX PADS: 1.0000 | MEDICATED_PAD | CUTANEOUS | Status: DC | PRN

## 2024-07-26 MED ORDER — SLYND 4 MG PO TABS
1.0000 | ORAL_TABLET | Freq: Every day | ORAL | 11 refills | Status: DC
  Filled 2024-07-26: qty 28, 28d supply, fill #0

## 2024-07-26 MED ORDER — ONDANSETRON HCL 4 MG PO TABS: 4.0000 mg | ORAL_TABLET | ORAL | Status: DC | PRN

## 2024-07-26 MED ORDER — IBUPROFEN 800 MG PO TABS
800.0000 mg | ORAL_TABLET | Freq: Three times a day (TID) | ORAL | Status: DC
  Administered 2024-07-26 – 2024-07-27 (×4): 800 mg via ORAL
  Filled 2024-07-26 (×4): qty 1

## 2024-07-26 MED ORDER — SODIUM CHLORIDE 0.9% FLUSH
3.0000 mL | Freq: Two times a day (BID) | INTRAVENOUS | Status: DC
  Administered 2024-07-26: 3 mL via INTRAVENOUS

## 2024-07-26 NOTE — Lactation Note (Signed)
 This note was copied from a baby's chart. Lactation Consultation Note  Patient Name: Allison Trevino Date: 07/26/2024 Age:20 hours Reason for consult: Follow-up assessment;Primapara;1st time breastfeeding;Exclusive pumping and bottle feeding;Early term 37-38.6wks;Maternal endocrine disorder  P62- RN requested for LC to consult with MOB because she has been formula feeding and supposedly wanting to breastfeed. When LC entered the room, MOB reported that she did not want to latch infant. LC reassured MOB that she does not have to latch. LC reviewed pumping with MOB. MOB reports that she is okay with pumping, but does not really want to. LC reassured MOB that she does not have to pump for infant either. MGM encouraged MOB to at least pump for infant, so MOB told LC that she would. LC set up the hospital DEBP with 18 mm flanges. MOB pumped for 15 minutes and collected 18 mL. LC praised MOB and reviewed CDC storage guidelines. LC provided MOB with a manual pump because her Spectra  she was gifted has mold.  LC reviewed the first 24 hr birthday nap, day 2 cluster feeding, feeding infant on cue 8-12x in 24 hrs, not allowing infant to go over 3 hrs without a feeding, CDC milk storage guidelines, LC services handout and engorgement/breast care. LC encouraged MOB to call for further assistance as needed.  Maternal Data Has patient been taught Hand Expression?: No Does the patient have breastfeeding experience prior to this delivery?: No  Feeding Mother's Current Feeding Choice: Breast Milk and Formula  Lactation Tools Discussed/Used Tools: Pump;Flanges Flange Size: 18 Breast pump type: Double-Electric Breast Pump;Manual Pump Education: Setup, frequency, and cleaning;Milk Storage Reason for Pumping: exclusive pumping Pumping frequency: 15-20 min every 3 hrs Pumped volume: 18 mL  Interventions Interventions: Breast feeding basics reviewed;Expressed milk;Hand  pump;DEBP;Education;LC Services brochure  Discharge Discharge Education: Engorgement and breast care;Warning signs for feeding baby Pump: Personal;DEBP;Manual WIC Program: Yes  Consult Status Consult Status: Follow-up Date: 07/27/24 Follow-up type: In-patient    Recardo Hoit BS, IBCLC 07/26/2024, 6:22 PM

## 2024-07-26 NOTE — Progress Notes (Signed)
MOB was referred for history of depression/anxiety. * Referral screened out by Clinical Social Worker because none of the following criteria appear to apply: ~ History of anxiety/depression during this pregnancy, or of post-partum depression following prior delivery. ~ Diagnosis of anxiety and/or depression within last 3 years. No concerns noted in OB record. OR * MOB's symptoms currently being treated with medication and/or therapy.  Please contact the Clinical Social Worker if needs arise, by MOB request, or if MOB scores greater than 9/yes to question 10 on Edinburgh Postpartum Depression Screen.  Jakhai Fant Boyd-Gilyard, MSW, LCSW Clinical Social Work [phone]   

## 2024-07-26 NOTE — Progress Notes (Signed)
 POSTPARTUM PROGRESS NOTE  Post Partum Day 1  Subjective:  Kaitlynne Wenz is a 19 y.o. G2P1011 s/p SVD at [redacted]w[redacted]d.  She reports she is doing well. No acute events overnight. She denies any problems with ambulating, voiding or po intake. Denies nausea or vomiting.  Pain is well controlled.  Lochia is normal.  Objective: Blood pressure 105/67, pulse 82, temperature 98 F (36.7 C), temperature source Oral, resp. rate 18, height 4' 11 (1.499 m), weight 61.1 kg, last menstrual period 11/08/2023, SpO2 99%, unknown if currently breastfeeding.  Physical Exam:  General: alert, cooperative and no distress Chest: no respiratory distress Heart:regular rate, distal pulses intact Abdomen: soft, nontender,  Uterine Fundus: firm, appropriately tender DVT Evaluation: No calf swelling or tenderness Extremities: Trace edema Skin: warm, dry  Recent Labs    07/24/24 0415  HGB 10.1*  HCT 29.5*    Assessment/Plan: Kiyah Demartini is a 19 y.o. G2P1011 s/p SVD at [redacted]w[redacted]d   PPD#1 - Doing well  Routine postpartum care  Contraception: POPs Feeding: Breast Dispo: Plan for discharge tomorrow .   LOS: 2 days   Barkley Angles, MD OB Fellow, Faculty Practice [facility], Center for [employer]

## 2024-07-26 NOTE — Lactation Note (Signed)
 This note was copied from a baby's chart. Lactation Consultation Note  Patient Name: Boy Kriste Broman Unijb'd Date: 07/26/2024 Age:20 hours Reason for consult: Follow-up assessment;Mother's request;Primapara;1st time breastfeeding;Exclusive pumping and bottle feeding;Early term 37-38.6wks;Maternal endocrine disorder  P1- MOB needed assistance with putting the pump back together. LC reviewed how to put it together correctly and got MOB pumping. Right after initiating the pump, MOB immediately had colostrum in the bottle. LC praised MOB and encouraged her to call for further assistance as needed.  Maternal Data Has patient been taught Hand Expression?: No Does the patient have breastfeeding experience prior to this delivery?: No  Feeding Mother's Current Feeding Choice: Breast Milk and Formula Nipple Type: Extra Slow Flow  Lactation Tools Discussed/Used Tools: Pump;Flanges Flange Size: 18 Breast pump type: Double-Electric Breast Pump;Manual Pump Education: Setup, frequency, and cleaning;Milk Storage Reason for Pumping: exclusive pumping Pumping frequency: 15-20 min every 3 hrs Pumped volume: 18 mL  Interventions Interventions: Breast feeding basics reviewed;Hand pump;DEBP;Education  Discharge Discharge Education: Engorgement and breast care;Warning signs for feeding baby Pump: Manual;Personal WIC Program: Yes  Consult Status Consult Status: Follow-up Date: 07/27/24 Follow-up type: In-patient    Recardo Hoit BS, IBCLC 07/26/2024, 7:54 PM

## 2024-07-26 NOTE — Discharge Instructions (Signed)
 Allison Trevino

## 2024-07-26 NOTE — Lactation Note (Signed)
 This note was copied from a baby's chart. Lactation Consultation Note  Patient Name: Allison Trevino Unijb'd Date: 07/26/2024 Age:20 hours Reason for consult: Initial assessment;Primapara;Early term 37-38.6wks  P1. Mom has only been formula feeding. Mom hasn't put baby to the breast yet. Mom would like to try to BF. Suggested mom call for next feeding for Lactation to come for assistance. MGM stated someone from her insurance brought her a DEBP but it was in grocery bag, not in a box and looked dirty and used. Mom doesn't want to use a dirty pump. There is no note on who brought the pump. Suggest contacting who brought the pump and talk to that person. Mention to mom about when to expect her mature milk to come in.  Mom encouraged to feed baby 8-12 times/24 hours and with feeding cues.  Mom stated he doesn't eat much. Informed how big a newborn baby's abdomin is and what is expected of them. Mom states understanding to call for Lactation for next feeding. Maternal Data Does the patient have breastfeeding experience prior to this delivery?: No  Feeding Nipple Type: Slow - flow  LATCH Score                    Lactation Tools Discussed/Used    Interventions    Discharge    Consult Status Consult Status: Follow-up Date: 07/26/24 Follow-up type: In-patient    Leslee Haueter G 07/26/2024, 5:51 AM

## 2024-07-26 NOTE — Anesthesia Postprocedure Evaluation (Signed)
"   Anesthesia Post Note  Patient: Sawsan Riggio  Procedure(s) Performed: AN AD HOC LABOR EPIDURAL     Patient location during evaluation: Mother Baby Anesthesia Type: Epidural Level of consciousness: awake and alert Pain management: pain level controlled Vital Signs Assessment: post-procedure vital signs reviewed and stable Respiratory status: spontaneous breathing, nonlabored ventilation and respiratory function stable Cardiovascular status: stable Postop Assessment: no headache, no backache, epidural receding and able to ambulate Anesthetic complications: no   No notable events documented.  Last Vitals:  Vitals:   07/26/24 0135 07/26/24 0528  BP: (!) 111/58 105/67  Pulse: 73 82  Resp: 18 18  Temp: 36.7 C 36.7 C  SpO2: 98% 99%    Last Pain:  Vitals:   07/26/24 0529  TempSrc:   PainSc: 0-No pain   Pain Goal:                   Brison Fiumara      "

## 2024-07-27 ENCOUNTER — Other Ambulatory Visit (HOSPITAL_COMMUNITY): Payer: Self-pay

## 2024-07-27 ENCOUNTER — Encounter: Payer: Self-pay | Admitting: Obstetrics and Gynecology

## 2024-07-27 MED ORDER — ACETAMINOPHEN 325 MG PO TABS: 650.0000 mg | ORAL_TABLET | Freq: Four times a day (QID) | ORAL | Status: AC | PRN

## 2024-07-27 MED ORDER — SENNOSIDES-DOCUSATE SODIUM 8.6-50 MG PO TABS
2.0000 | ORAL_TABLET | ORAL | 0 refills | Status: AC
  Filled 2024-07-27: qty 60, 30d supply, fill #0

## 2024-07-27 MED ORDER — IBUPROFEN 800 MG PO TABS
800.0000 mg | ORAL_TABLET | Freq: Four times a day (QID) | ORAL | 0 refills | Status: AC | PRN
  Filled 2024-07-27: qty 30, 8d supply, fill #0

## 2024-07-27 NOTE — Progress Notes (Signed)
 CSW received consult for hx of Anxiety and Depression.  CSW called an assessed MOB remotely due to inclement weather.   MOB shared hat MGM was in the room however, she expressed feeling comfortable with being assessed while she was present.   CSW asked about MOB's MH hx.   MOB reported a hx of nx/depression. Per MOB she was dx around age 19.  MOB denied the use of medication however she reported meeting with therapist, Jasmine Brown, at [facility] monthly.  MOB shared that she and therapist as discussed PMADs throughout her pregnancy and MOB next scheduled appointment is scheduled for August 26, 2024 .  Per MOB if she feels symptoms worsen prior to March 3, she feels comfortable requesting a sooner appointment.   CSW provided education regarding the baby blues period vs. perinatal mood disorders, discussed treatment and gave resources for mental health follow up if concerns arise.  CSW recommends self-evaluation during the postpartum time period using the New Mom Checklist from Postpartum Progress and encouraged MOB to contact a medical professional if symptoms are noted at any time. CSW assessed for safety and MOB denied SI, HI, and DV.  MOB reports having a good support team that consists of her immediate family and she reported feeling comfortable seeking help if needed. Per MOB she has all essential items to care for infant post discharge.    CSW provided review of Sudden Infant Death Syndrome (SIDS) precautions.    CSW identifies no further need for intervention and no barriers to discharge at this time.   CSW emailed PMADs literature to MOB at [email or web address]  Clayborne Hope, MSW, LCSW Clinical Social Work [phone]

## 2024-07-27 NOTE — Progress Notes (Signed)
 Pt's affect is flat. RN explained postpartum depression. Pt started to cry. RN encouraged pt to express herself and pt stopped crying. Pt has a history of si (attributed to a medication). Based on this patient's demeanor, I feel that additional education/support/awareness from healthcare team would be beneficial.

## 2024-07-27 NOTE — Lactation Note (Signed)
 This note was copied from a baby's chart. Lactation Consultation Note  Patient Name: Allison Trevino Unijb'd Date: 07/27/2024 Age:20 hours Reason for consult: Follow-up assessment;Maternal discharge;1st time breastfeeding;Exclusive pumping and bottle feeding.Infant with weight gain of 1.04%  P1, MOB feeding choice is pumping only and formula feeding infant, MOB is now expressing 10-15 mls of EBM when pumping. MOB is offer infant her EBM first and then 20 kcal formula. Infant is currently consuming 15 mls of EBM/fomula per feeding. MOB has hand pump due to issue with receiving a dirty DEBP yesterday from her insurance carrier. Will call her insurance company tomorrow for a new pump. MOB was given hand pump to take home until issues with her DEBP with her insurance is resolved.   MOB knows that her EBM is safe for 4 hours at room temperature whereas RTF formula once open only safe for 1 hour.  Discharged education: Day 3 1- MOB will continue to feed infant by cues, on demand, 8+ times within 24 hours. MOB will continue to offer any EBM first and then formula. MOB has hand out on  Feeding Amounts know on Day 2 to offer infant 15-30 mls per feeding. 2- LC discussed engorgement treatment and prevention and warning signs of dehydration in infant.  3- MOB has handout regarding breastfeeding community resources after hospital discharge: LC hotline, LC breastfeeding support group and [facility] outpatient clinic.  Maternal Data    Feeding Mother's Current Feeding Choice: Breast Milk and Formula  LATCH Score   MOB is exclusively pumping and offering formula                 Lactation Tools Discussed/Used    Interventions    Discharge Discharge Education: Engorgement and breast care;Warning signs for feeding baby Pump: Manual;Advised to call insurance company (MOB will talk with carrier of her  iinsurance company who [facility] a  [facility]. Will call them on Monday. MOB  will temporarily use her hand pump until the issue with her DEBP is resolved.)  Consult Status Consult Status: Complete Follow-up type: Physician    [name] 07/27/2024, 9:59 AM

## 2024-08-01 ENCOUNTER — Inpatient Hospital Stay (HOSPITAL_COMMUNITY)
Admission: AD | Admit: 2024-08-01 | Discharge: 2024-08-01 | Disposition: A | Discharge status: Home / Self Care | Payer: MEDICAID | Source: Home / Self Care | Attending: Obstetrics & Gynecology | Admitting: Obstetrics & Gynecology

## 2024-08-01 ENCOUNTER — Other Ambulatory Visit: Payer: Self-pay

## 2024-08-01 ENCOUNTER — Encounter (HOSPITAL_COMMUNITY): Payer: Self-pay | Admitting: Obstetrics & Gynecology

## 2024-08-01 DIAGNOSIS — N719 Inflammatory disease of uterus, unspecified: Secondary | ICD-10-CM

## 2024-08-01 DIAGNOSIS — R509 Fever, unspecified: Secondary | ICD-10-CM

## 2024-08-01 LAB — COMPREHENSIVE METABOLIC PANEL WITH GFR
ALT: 13 U/L (ref 0–44)
AST: 16 U/L (ref 15–41)
Albumin: 4 g/dL (ref 3.5–5.0)
Alkaline Phosphatase: 149 U/L — ABNORMAL HIGH (ref 38–126)
Anion gap: 14 (ref 5–15)
BUN: 7 mg/dL (ref 6–20)
CO2: 22 mmol/L (ref 22–32)
Calcium: 9.3 mg/dL (ref 8.9–10.3)
Chloride: 103 mmol/L (ref 98–111)
Creatinine, Ser: 0.58 mg/dL (ref 0.44–1.00)
GFR, Estimated: >60 mL/min
Glucose, Bld: 100 mg/dL — ABNORMAL HIGH (ref 70–99)
Potassium: 3.1 mmol/L — ABNORMAL LOW (ref 3.5–5.1)
Sodium: 139 mmol/L (ref 135–145)
Total Bilirubin: 0.5 mg/dL (ref 0.0–1.2)
Total Protein: 7.5 g/dL (ref 6.5–8.1)

## 2024-08-01 LAB — RESPIRATORY PANEL BY PCR

## 2024-08-01 LAB — URINALYSIS, ROUTINE W REFLEX MICROSCOPIC
Bilirubin Urine: NEGATIVE
Glucose, UA: NEGATIVE mg/dL
Ketones, ur: 20 mg/dL — AB
Nitrite: NEGATIVE
Protein, ur: 100 mg/dL — AB
Specific Gravity, Urine: 1.016 (ref 1.005–1.030)
WBC, UA: >50 WBC/hpf (ref 0–5)
pH: 5 (ref 5.0–8.0)

## 2024-08-01 LAB — TYPE AND SCREEN
ABO/RH(D): O POS
Antibody Screen: NEGATIVE

## 2024-08-01 LAB — CBC WITH DIFFERENTIAL/PLATELET
Abs Immature Granulocytes: 0.08 10*3/uL — ABNORMAL HIGH (ref 0.00–0.07)
Basophils Absolute: 0.1 10*3/uL (ref 0.0–0.1)
Basophils Relative: 0 %
Eosinophils Absolute: 0 10*3/uL (ref 0.0–0.5)
Eosinophils Relative: 0 %
HCT: 37.9 % (ref 36.0–46.0)
Hemoglobin: 12.6 g/dL (ref 12.0–15.0)
Immature Granulocytes: 1 %
Lymphocytes Relative: 7 %
Lymphs Abs: 0.9 10*3/uL (ref 0.7–4.0)
MCH: 30.2 pg (ref 26.0–34.0)
MCHC: 33.2 g/dL (ref 30.0–36.0)
MCV: 90.9 fL (ref 80.0–100.0)
Monocytes Absolute: 0.9 10*3/uL (ref 0.1–1.0)
Monocytes Relative: 8 %
Neutro Abs: 10 10*3/uL — ABNORMAL HIGH (ref 1.7–7.7)
Neutrophils Relative %: 84 %
Platelets: 337 10*3/uL (ref 150–400)
RBC: 4.17 MIL/uL (ref 3.87–5.11)
RDW: 18.4 % — ABNORMAL HIGH (ref 11.5–15.5)
WBC: 12 10*3/uL — ABNORMAL HIGH (ref 4.0–10.5)
nRBC: 0 % (ref 0.0–0.2)

## 2024-08-01 LAB — PROTIME-INR
INR: 1 (ref 0.8–1.2)
Prothrombin Time: 14 s (ref 11.4–15.2)

## 2024-08-01 LAB — RESP PANEL BY RT-PCR (RSV, FLU A&B, COVID)  RVPGX2
Influenza A by PCR: NEGATIVE
Influenza B by PCR: NEGATIVE
Resp Syncytial Virus by PCR: NEGATIVE
SARS Coronavirus 2 by RT PCR: NEGATIVE

## 2024-08-01 LAB — APTT: aPTT: 28 s (ref 24–36)

## 2024-08-01 LAB — LACTIC ACID, PLASMA
Lactic Acid, Venous: 2.4 mmol/L (ref 0.5–1.9)
Lactic Acid, Venous: 0.6 mmol/L (ref 0.5–1.9)

## 2024-08-01 LAB — PRO BRAIN NATRIURETIC PEPTIDE: Pro Brain Natriuretic Peptide: 81.1 pg/mL

## 2024-08-01 MED ORDER — LACTATED RINGERS IV BOLUS (SEPSIS)
1000.0000 mL | Freq: Once | INTRAVENOUS | Status: AC
  Administered 2024-08-01: 1000 mL via INTRAVENOUS

## 2024-08-01 MED ORDER — LACTATED RINGERS IV SOLN
150.0000 mL/h | INTRAVENOUS | Status: DC
  Administered 2024-08-01: 150 mL/h via INTRAVENOUS

## 2024-08-01 MED ORDER — ACETAMINOPHEN 10 MG/ML IV SOLN
1000.0000 mg | Freq: Once | INTRAVENOUS | Status: AC
  Administered 2024-08-01: 1000 mg via INTRAVENOUS
  Filled 2024-08-01: qty 100

## 2024-08-01 MED ORDER — AMOXICILLIN-POT CLAVULANATE 875-125 MG PO TABS: 1.0000 | ORAL_TABLET | Freq: Two times a day (BID) | ORAL | 0 refills | Status: AC

## 2024-08-01 MED ORDER — DIPHENHYDRAMINE HCL 50 MG/ML IJ SOLN
25.0000 mg | Freq: Once | INTRAMUSCULAR | Status: AC
  Administered 2024-08-01: 25 mg via INTRAVENOUS
  Filled 2024-08-01: qty 1

## 2024-08-01 MED ORDER — METOCLOPRAMIDE HCL 5 MG/ML IJ SOLN
10.0000 mg | Freq: Once | INTRAMUSCULAR | Status: AC
  Administered 2024-08-01: 10 mg via INTRAVENOUS
  Filled 2024-08-01: qty 2

## 2024-08-01 MED ORDER — LACTATED RINGERS IV BOLUS: 250.0000 mL | INTRAVENOUS | Status: DC

## 2024-08-01 NOTE — Sepsis Progress Note (Signed)
 Elink will follow per sepsis protocol.

## 2024-08-01 NOTE — Sepsis Progress Note (Signed)
Code sepsis cancelled 

## 2024-08-01 NOTE — Progress Notes (Signed)
 CRITICAL VALUE STICKER  CRITICAL VALUE: Lactic Acid  RECEIVER (on-site recipient of call): Dacen Frayre, RN  DATE & TIME NOTIFIED: 08/01/24  1551  MESSENGER (representative from lab): Othella  MD NOTIFIED: L. Cooleen NP  TIME OF NOTIFICATION: 1553  RESPONSE:  no new orders at this time

## 2024-08-01 NOTE — MAU Note (Signed)
 Allison Trevino is a 19 y.o. at Unknown here in MAU reporting: she's having chills, fever, HA, pain in calves, mid back pain, and pain behind ears bilaterally.  Reports she has HA that's been unrelieved with Ibuprofen , last taken  @ 0700.  Also reports took temp at 1250 this afternoon and temp 103.6.  S/P NVD 07/25/2024  LMP: NA Onset of complaint: today Pain score: 10 Back & Head 8 Calves Vitals:   08/01/24 1345  BP: 103/63  Pulse: (!) 149  Resp: 20  Temp: (!) 103.1 F (39.5 C)  SpO2: 98%     FHT: NA  Lab orders placed from triage: UA

## 2024-08-01 NOTE — MAU Provider Note (Cosign Needed)
 1516/ Antibiotics held pending results of labs. -------------------------------------------------------------- Allison Dalton, MSN, [facility], Center for [facility]

## 2024-08-01 NOTE — MAU Provider Note (Cosign Needed)
 Chief Complaint:  Chills, Fever, and Headache   HPI  PPD # 7 S/P NSVD 07/25/24    Allison Trevino is a 19 y.o. G2P1011 at Unknown who presents to maternity admissions reporting patient is status post NSVD on 07/25/2024.  Patient was induced for cholestasis of pregnancy.  Her postpartum course was complicated by a first-degree perineal laceration that did not need repair.  Currently she is breast and bottlefeeding without difficulty and offers no breast complaints with lactating.    Patient reports that this morning she awoke with a fever, chills, and a severe headache rating it 10 out of 10 on a pain scale.  Patient denies any sick contacts and states she has had a normal postpartum course thus far.  She denies any foul-smelling lochia or severe abdominal pain.   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   Depression    History of suicidal ideation    IBS (irritable bowel syndrome)    Insomnia 04/08/2019   Migraines    Severe anxiety    followed by dr macarthur piety   OB History  Gravida Para Term Preterm AB Living  2 1 1  0 1 1  SAB IAB Ectopic Multiple Live Births  1 0 0 0 1    # Outcome Date GA Lbr Len/2nd Weight Sex Type Anes PTL Lv  2 Term 07/25/24 [redacted]w[redacted]d / 03:11 3350 g M Vag-Spont EPI  LIV  1 SAB 12/06/22 [redacted]w[redacted]d          Past Surgical History:  Procedure Laterality Date   CHOLECYSTECTOMY N/A 10/14/2023   Procedure: LAPAROSCOPIC CHOLECYSTECTOMY;  Surgeon: Belinda Cough, MD;  Location: [facility];  Service: General;  Laterality: N/A;   COLONOSCOPY N/A 10/11/2023   Procedure: COLONOSCOPY;  Surgeon: Dianna Specking, MD;  Location: [facility];  Service: Gastroenterology;  Laterality: N/A;   DILATION AND EVACUATION N/A 12/06/2022   Procedure: DILATATION AND EVACUATION;  Surgeon: Erik Kieth BROCKS, MD;  Location: [facility];  Service: Gynecology;  Laterality: N/A;   Family History   Problem Relation Age of Onset   Diabetes Mother    Healthy Father    Hypertension Neg Hx    Cancer Neg Hx    Social History[1] Allergies[2] No medications prior to admission.    I have reviewed patient's Past Medical Hx, Surgical Hx, Family Hx, Social Hx, medications and allergies.   ROS  Pertinent items noted in HPI and remainder of comprehensive ROS otherwise negative.   PHYSICAL EXAM  Patient Vitals for the past 24 hrs:  BP Temp Temp src Pulse Resp SpO2 Height Weight  08/01/24 1736 (!) 101/55 -- -- (!) 107 -- -- -- --  08/01/24 1545 101/60 -- -- 96 -- -- -- --  08/01/24 1530 (!) 90/48 (!) 101.2 F (38.4 C) Oral (!) 107 -- -- -- --  08/01/24 1511 (!) 91/40 -- -- (!) 108 -- -- -- --  08/01/24 1500 (!) 76/30 -- -- (!) 118 -- -- -- --  08/01/24 1445 (!) 102/56 -- -- (!) 121 -- -- -- --  08/01/24 1434 108/61 -- -- (!) 123 -- -- -- --  08/01/24 1417 109/69 -- -- (!) 133 -- -- -- --  08/01/24 1345 103/63 (!) 103.1 F (39.5 C) Oral (!) 149 20 98 % -- --  08/01/24 1335 -- -- -- -- -- -- 4' 11 (1.499 m) 53.3 kg    Constitutional: Well-developed, thin  female who appears  ill Cardiovascular: tachycardia , patient is hypotensive at her baseline Respiratory: normal effort, tachypneic, lungs BCTA Breasts: No lumps, no warmth or erythema and no engorgement on palpation  GI: Abd soft, mild tenderness on light palpation,  FF MS: Extremities nontender, no edema, normal ROM Neurologic: Alert and oriented x 4.  GU: B/L CVAT Positive  Pelvic: Normal non-malodorous lochia visualized     Labs: Results for orders placed or performed during the hospital encounter of 08/01/24 (from the past 24 hours)  Urinalysis, Routine w reflex microscopic -Urine, Clean Catch     Status: Abnormal   Collection Time: 08/01/24  1:52 PM  Result Value Ref Range   Color, Urine AMBER (A) YELLOW   APPearance TURBID (A) CLEAR   Specific Gravity, Urine 1.016 1.005 - 1.030   pH 5.0 5.0 - 8.0   Glucose, UA  NEGATIVE NEGATIVE mg/dL   Hgb urine dipstick LARGE (A) NEGATIVE   Bilirubin Urine NEGATIVE NEGATIVE   Ketones, ur 20 (A) NEGATIVE mg/dL   Protein, ur 899 (A) NEGATIVE mg/dL   Nitrite NEGATIVE NEGATIVE   Leukocytes,Ua LARGE (A) NEGATIVE   RBC / HPF 21-50 0 - 5 RBC/hpf   WBC, UA >50 0 - 5 WBC/hpf   Bacteria, UA RARE (A) NONE SEEN   Squamous Epithelial / HPF 0-5 0 - 5 /HPF   WBC Clumps PRESENT    Mucus PRESENT   CBC with Differential     Status: Abnormal   Collection Time: 08/01/24  2:02 PM  Result Value Ref Range   WBC 12.0 (H) 4.0 - 10.5 K/uL   RBC 4.17 3.87 - 5.11 MIL/uL   Hemoglobin 12.6 12.0 - 15.0 g/dL   HCT 62.0 63.9 - 53.9 %   MCV 90.9 80.0 - 100.0 fL   MCH 30.2 26.0 - 34.0 pg   MCHC 33.2 30.0 - 36.0 g/dL   RDW 81.5 (H) 88.4 - 84.4 %   Platelets 337 150 - 400 K/uL   nRBC 0.0 0.0 - 0.2 %   Neutrophils Relative % 84 %   Neutro Abs 10.0 (H) 1.7 - 7.7 K/uL   Lymphocytes Relative 7 %   Lymphs Abs 0.9 0.7 - 4.0 K/uL   Monocytes Relative 8 %   Monocytes Absolute 0.9 0.1 - 1.0 K/uL   Eosinophils Relative 0 %   Eosinophils Absolute 0.0 0.0 - 0.5 K/uL   Basophils Relative 0 %   Basophils Absolute 0.1 0.0 - 0.1 K/uL   Immature Granulocytes 1 %   Abs Immature Granulocytes 0.08 (H) 0.00 - 0.07 K/uL  Comprehensive metabolic panel     Status: Abnormal   Collection Time: 08/01/24  2:02 PM  Result Value Ref Range   Sodium 139 135 - 145 mmol/L   Potassium 3.1 (L) 3.5 - 5.1 mmol/L   Chloride 103 98 - 111 mmol/L   CO2 22 22 - 32 mmol/L   Glucose, Bld 100 (H) 70 - 99 mg/dL   BUN 7 6 - 20 mg/dL   Creatinine, Ser 9.41 0.44 - 1.00 mg/dL   Calcium 9.3 8.9 - 89.6 mg/dL   Total Protein 7.5 6.5 - 8.1 g/dL   Albumin 4.0 3.5 - 5.0 g/dL   AST 16 15 - 41 U/L   ALT 13 0 - 44 U/L   Alkaline Phosphatase 149 (H) 38 - 126 U/L   Total Bilirubin 0.5 0.0 - 1.2 mg/dL   GFR, Estimated >39 >39 mL/min   Anion gap 14 5 - 15  Pro Brain  natriuretic peptide     Status: None   Collection Time: 08/01/24   2:02 PM  Result Value Ref Range   Pro Brain Natriuretic Peptide 81.1 <300.0 pg/mL  Type and screen [facility]     Status: None   Collection Time: 08/01/24  2:03 PM  Result Value Ref Range   ABO/RH(D) O POS    Antibody Screen NEG    Sample Expiration      08/04/2024,2359 Performed at [facility], [address] [place], [place] [address]   Resp panel by RT-PCR (RSV, Flu A&B, Covid) Anterior Nasal Swab     Status: None   Collection Time: 08/01/24  2:05 PM   Specimen: Anterior Nasal Swab  Result Value Ref Range   SARS Coronavirus 2 by RT PCR NEGATIVE NEGATIVE   Influenza A by PCR NEGATIVE NEGATIVE   Influenza B by PCR NEGATIVE NEGATIVE   Resp Syncytial Virus by PCR NEGATIVE NEGATIVE  Respiratory (~20 pathogens) panel by PCR     Status: Abnormal   Collection Time: 08/01/24  2:05 PM   Specimen: Nasopharyngeal Swab; Respiratory  Result Value Ref Range   Adenovirus NOT DETECTED NOT DETECTED   Coronavirus 229E NOT DETECTED NOT DETECTED   Coronavirus HKU1 NOT DETECTED NOT DETECTED   Coronavirus NL63 NOT DETECTED NOT DETECTED   Coronavirus OC43 NOT DETECTED NOT DETECTED   Metapneumovirus NOT DETECTED NOT DETECTED   Rhinovirus / Enterovirus DETECTED (A) NOT DETECTED   Influenza A NOT DETECTED NOT DETECTED   Influenza B NOT DETECTED NOT DETECTED   Parainfluenza Virus 1 NOT DETECTED NOT DETECTED   Parainfluenza Virus 2 NOT DETECTED NOT DETECTED   Parainfluenza Virus 3 NOT DETECTED NOT DETECTED   Parainfluenza Virus 4 NOT DETECTED NOT DETECTED   Respiratory Syncytial Virus NOT DETECTED NOT DETECTED   Bordetella pertussis NOT DETECTED NOT DETECTED   Bordetella Parapertussis NOT DETECTED NOT DETECTED   Chlamydophila pneumoniae NOT DETECTED NOT DETECTED   Mycoplasma pneumoniae NOT DETECTED NOT DETECTED  Lactic acid, plasma     Status: Abnormal   Collection Time: 08/01/24  2:41 PM  Result Value Ref Range   Lactic Acid, Venous 2.4 (HH) 0.5 - 1.9 mmol/L   Protime-INR     Status: None   Collection Time: 08/01/24  2:41 PM  Result Value Ref Range   Prothrombin Time 14.0 11.4 - 15.2 seconds   INR 1.0 0.8 - 1.2  APTT     Status: None   Collection Time: 08/01/24  2:41 PM  Result Value Ref Range   aPTT 28 24 - 36 seconds  Lactic acid, plasma     Status: None   Collection Time: 08/01/24  4:28 PM  Result Value Ref Range   Lactic Acid, Venous 0.6 0.5 - 1.9 mmol/L    Imaging:  No results found.  MDM & MAU COURSE  MDM:  HIGH -   R/O Sepsis  Code Sepsis protocol ( Dr Jayne [facility] Attending aware @1427 )  Orders in place @ 1535 preliminary results DW Dr Jayne [facility] Attending) Code Sepsis Dc'd - Awaiting remaining lab results  Orders in place EKG: Sinus Tachardia   @1618  Dr Jayne aware of labs results  Adding expanded Respiratory panel at this time Rapid Flu/COVID/RSV Negative Continue IVF's  Pending results will discuss disposition    @ 1700  Case DW Dr Fredirick [facility] Attending present in the MAU)   Repeat Lactic acid 0.6 Patient feeling better after IVF Hydration  Will empirically treat for endometritis with Augmentin  ( S/R/B discussed with patient ) DC Home with strict return precautions and OB F/U in 1 week  MAU Course: Orders Placed This Encounter  Procedures   Culture, blood (x 2)   Resp panel by RT-PCR (RSV, Flu A&B, Covid) Anterior Nasal Swab   Culture, OB Urine   Respiratory (~20 pathogens) panel by PCR   Urinalysis, Routine w reflex microscopic -Urine, Clean Catch   CBC with Differential   Comprehensive metabolic panel   Pro Brain natriuretic peptide   Protime-INR   APTT   Lactic acid, plasma   Refer to Sidebar Report: Sepsis Bundle ED/IP   Apply Sepsis Care Plan   If lactate (lactic acid) >2, verify repeat lactic acid order has been placed to be drawn   Document vital signs within 1-hour of fluid bolus completion and notify provider of bolus completion   Vital signs   Vital signs   Assess and Document Glasgow Coma  Scale   RN to call RRT (rapid response team) and provider   Droplet precaution   EKG 12-Lead   Type and screen [facility]   Discharge patient Discharge disposition: 01-Home or Self Care; Discharge patient date: 08/01/2024   Meds ordered this encounter  Medications   lactated ringers  infusion   lactated ringers  bolus 1,000 mL    Reason 30 mL/kg dose is not being ordered:   First Lactic Acid Pending   acetaminophen  (OFIRMEV ) IV 1,000 mg    Is the patient UNABLE to take oral / enteral medications?:   No   metoCLOPramide  (REGLAN ) injection 10 mg   diphenhydrAMINE  (BENADRYL ) injection 25 mg   lactated ringers  bolus 250 mL   amoxicillin -clavulanate (AUGMENTIN ) 875-125 MG tablet    Sig: Take 1 tablet by mouth 2 (two) times daily for 10 days.    Dispense:  20 tablet    Refill:  0    Supervising Provider:   PRATT, TANYA S [[id number]]     ASSESSMENT   1. Endometritis   2. Fever, unspecified fever cause   3. Postpartum state     PLAN  Discharge home in stable condition with strict  return precautions.   See AVS for full description of information given to the patient including both verbal and written. Patient verbalized understanding and agrees with the plan as described above.     Follow-up Information     [facility]: Lambert Rhonda W Follow up in 1 week(s).   Specialty: Obstetrics and Gynecology Why: If symptoms worsen or fail to resolve, As scheduled for postpartum follow-up Contact information: [address] [place]  [address] [phone]                Allergies as of 08/01/2024       Reactions   Azithromycin  Itching, Nausea And Vomiting   Severe N/V and severe itching   Lactose Intolerance (gi) Other (See Comments)   GI Upset   Metronidazole  Itching   Hydromorphone  Rash   Possible allergy to opiates including oxycodone  / dilaudid  / Fentanyl . Received opiate medications (dilaudid , fentanyl ) as  well as Augmentin  on 4/15 developed rash. On 4/16 received Oxycodone  and Ceftriaxone  around same time + developed recurrent rash / itching.   Hydroxyzine  Nausea Only   Reported reaction of suicidal Ideation, hallucinations in the past, but not with recent use (2026) - only had nausea        Medication List  STOP taking these medications    Slynd  4 MG Tabs Generic drug: Drospirenone        TAKE these medications    acetaminophen  325 MG tablet Commonly known as: Tylenol  Take 2 tablets (650 mg total) by mouth every 6 (six) hours as needed for mild pain (pain score 1-3) (for pain scale < 4).   amoxicillin -clavulanate 875-125 MG tablet Commonly known as: AUGMENTIN  Take 1 tablet by mouth 2 (two) times daily for 10 days.   cyclobenzaprine  10 MG tablet Commonly known as: FLEXERIL  Take 1 tablet (10 mg total) by mouth every 8 (eight) hours as needed for muscle spasms.   famotidine  20 MG tablet Commonly known as: Pepcid  Take 1 tablet (20 mg total) by mouth 2 (two) times daily.   Ferric Maltol  30 MG Caps Take 1 capsule (30 mg total) by mouth 2 (two) times daily. Please take one hour before breakfast and dinner   ibuprofen  800 MG tablet Commonly known as: ADVIL  Take 1 tablet (800 mg total) by mouth every 6 (six) hours as needed.   ondansetron  4 MG disintegrating tablet Commonly known as: ZOFRAN -ODT Take 1 tablet (4 mg total) by mouth every 6 (six) hours as needed for nausea.   PRENATAL VITAMIN PLUS LOW IRON  PO Take 1 tablet by mouth daily.   Stool Softener/Laxative 50-8.6 MG tablet Generic drug: senna-docusate Take 2 tablets by mouth daily.        Olam Dalton, MSN, WHNP-BC [facility] [employer]       [1]  Social History Tobacco Use   Smoking status: Never    Passive exposure: Current   Smokeless tobacco: Never  Vaping Use   Vaping status: Never Used  Substance Use Topics   Alcohol use: Never   Drug use: Not Currently     Types: Marijuana    Comment: pos drug screen. Has not used since couple of weeks  [2]  Allergies Allergen Reactions   Azithromycin  Itching and Nausea And Vomiting    Severe N/V and severe itching   Lactose Intolerance (Gi) Other (See Comments)    GI Upset   Metronidazole  Itching   Hydromorphone  Rash    Possible allergy to opiates including oxycodone  / dilaudid  / Fentanyl . Received opiate medications (dilaudid , fentanyl ) as well as Augmentin  on 4/15 developed rash. On 4/16 received Oxycodone  and Ceftriaxone  around same time + developed recurrent rash / itching.   Hydroxyzine  Nausea Only    Reported reaction of suicidal Ideation, hallucinations in the past, but not with recent use (2026) - only had nausea

## 2024-09-09 ENCOUNTER — Other Ambulatory Visit: Payer: Self-pay

## 2024-09-09 ENCOUNTER — Ambulatory Visit: Payer: Self-pay | Admitting: Physician Assistant
